# Patient Record
Sex: Female | Born: 1935
Health system: Southern US, Community
[De-identification: ages and names within clinical notes are randomized; demographics above are authoritative.]

## PROBLEM LIST (undated history)

## (undated) DIAGNOSIS — E785 Hyperlipidemia, unspecified: Secondary | ICD-10-CM

## (undated) DIAGNOSIS — I10 Essential (primary) hypertension: Secondary | ICD-10-CM

---

## 2004-08-04 ENCOUNTER — Ambulatory Visit (HOSPITAL_COMMUNITY): Admission: RE | Admit: 2004-08-04 | Discharge: 2004-08-04 | Payer: Self-pay | Admitting: Family Medicine

## 2005-08-07 ENCOUNTER — Ambulatory Visit (HOSPITAL_COMMUNITY): Admission: RE | Admit: 2005-08-07 | Discharge: 2005-08-07 | Payer: Self-pay | Admitting: Family Medicine

## 2006-08-21 ENCOUNTER — Ambulatory Visit (HOSPITAL_COMMUNITY): Admission: RE | Admit: 2006-08-21 | Discharge: 2006-08-21 | Payer: Self-pay | Admitting: Family Medicine

## 2007-08-22 ENCOUNTER — Ambulatory Visit (HOSPITAL_COMMUNITY): Admission: RE | Admit: 2007-08-22 | Discharge: 2007-08-22 | Payer: Self-pay | Admitting: Family Medicine

## 2008-09-02 ENCOUNTER — Ambulatory Visit (HOSPITAL_COMMUNITY): Admission: RE | Admit: 2008-09-02 | Discharge: 2008-09-02 | Payer: Self-pay | Admitting: Family Medicine

## 2009-10-12 ENCOUNTER — Ambulatory Visit (HOSPITAL_COMMUNITY): Admission: RE | Admit: 2009-10-12 | Discharge: 2009-10-12 | Payer: Self-pay | Admitting: Family Medicine

## 2010-04-10 ENCOUNTER — Encounter: Payer: Self-pay | Admitting: Family Medicine

## 2010-04-11 ENCOUNTER — Encounter: Payer: Self-pay | Admitting: Pulmonary Disease

## 2010-11-15 ENCOUNTER — Other Ambulatory Visit (HOSPITAL_COMMUNITY): Payer: Self-pay | Admitting: Family Medicine

## 2010-11-15 DIAGNOSIS — Z139 Encounter for screening, unspecified: Secondary | ICD-10-CM

## 2010-11-23 ENCOUNTER — Ambulatory Visit (HOSPITAL_COMMUNITY)
Admission: RE | Admit: 2010-11-23 | Discharge: 2010-11-23 | Disposition: A | Discharge status: Home / Self Care | Payer: Medicare Other | Source: Ambulatory Visit | Attending: Family Medicine | Admitting: Family Medicine

## 2010-11-23 DIAGNOSIS — Z139 Encounter for screening, unspecified: Secondary | ICD-10-CM

## 2010-11-23 DIAGNOSIS — Z1231 Encounter for screening mammogram for malignant neoplasm of breast: Secondary | ICD-10-CM | POA: Insufficient documentation

## 2010-11-24 ENCOUNTER — Ambulatory Visit (HOSPITAL_COMMUNITY): Payer: Medicare Other

## 2010-12-29 ENCOUNTER — Other Ambulatory Visit (HOSPITAL_COMMUNITY): Payer: Self-pay | Admitting: Family Medicine

## 2010-12-29 DIAGNOSIS — Z139 Encounter for screening, unspecified: Secondary | ICD-10-CM

## 2011-01-04 ENCOUNTER — Ambulatory Visit (HOSPITAL_COMMUNITY)
Admission: RE | Admit: 2011-01-04 | Discharge: 2011-01-04 | Disposition: A | Discharge status: Home / Self Care | Payer: Medicare Other | Source: Ambulatory Visit | Attending: Family Medicine | Admitting: Family Medicine

## 2011-01-04 DIAGNOSIS — Z13820 Encounter for screening for osteoporosis: Secondary | ICD-10-CM | POA: Insufficient documentation

## 2011-01-04 DIAGNOSIS — Z139 Encounter for screening, unspecified: Secondary | ICD-10-CM

## 2011-01-04 DIAGNOSIS — Z780 Asymptomatic menopausal state: Secondary | ICD-10-CM | POA: Insufficient documentation

## 2011-04-17 ENCOUNTER — Telehealth: Payer: Self-pay

## 2011-04-17 NOTE — Telephone Encounter (Signed)
Gastroenterology Pre-Procedure Form   Pt said she will call back in 2 weeks to schedule appt.    Request Date: 04/17/2011       Requesting Physician: Dr.Fusco     PATIENT INFORMATION:  Miranda Rose is a 76 y.o., female (DOB=Oct 03, 1935).  PROCEDURE: Procedure(s) requested: colonoscopy Procedure Reason: screening for colon cancer  PATIENT REVIEW QUESTIONS: The patient reports the following:   1. Diabetes Melitis: no 2. Joint replacements in the past 12 months: no 3. Major health problems in the past 3 months: no 4. Has an artificial valve or MVP:no 5. Has been advised in past to take antibiotics in advance of a procedure like teeth cleaning: no}    MEDICATIONS & ALLERGIES:    Patient reports the following regarding taking any blood thinners:   Plavix? no Aspirin?no Coumadin?  no  Patient confirms/reports the following medications:  Current Outpatient Prescriptions  Medication Sig Dispense Refill  . enalapril-hydrochlorothiazide (VASERETIC) 10-25 MG per tablet Take 1 tablet by mouth daily.      . pravastatin (PRAVACHOL) 40 MG tablet Take 40 mg by mouth daily.        Patient confirms/reports the following allergies:  No Known Allergies  Patient is appropriate to schedule for requested procedure(s): yes  AUTHORIZATION INFORMATION Primary Insurance:   ID #:   Group #:  Pre-Cert / Auth required:  Pre-Cert / Auth #:   Secondary Insurance:   ID #:   Group #:  Pre-Cert / Auth required Pre-Cert / Auth #:   No orders of the defined types were placed in this encounter.    SCHEDULE INFORMATION: Procedure has been scheduled as follows:  Date:                    Time:   Location:   This Gastroenterology Pre-Precedure Form is being routed to the following provider(s) for review: R. Roetta Sessions, MD    Called pt, she is not home.

## 2011-04-17 NOTE — Telephone Encounter (Signed)
OK as is.

## 2011-04-24 NOTE — Telephone Encounter (Signed)
Called pt. She will review schedule and call me back. Said she would like to have it week after next.

## 2011-05-11 NOTE — Telephone Encounter (Signed)
Letter mailed to pt to call to get scheduled.

## 2011-08-07 ENCOUNTER — Telehealth: Payer: Self-pay

## 2011-08-07 ENCOUNTER — Other Ambulatory Visit: Payer: Self-pay

## 2011-08-07 DIAGNOSIS — Z139 Encounter for screening, unspecified: Secondary | ICD-10-CM

## 2011-08-07 MED ORDER — PEG 3350-KCL-NA BICARB-NACL 420 G PO SOLR: ORAL | Status: AC

## 2011-08-07 NOTE — Telephone Encounter (Signed)
Rx and instructions mailed to pt.  

## 2011-08-07 NOTE — Progress Notes (Signed)
Rx and instructions mailed to pt.  

## 2011-08-07 NOTE — Telephone Encounter (Signed)
Gastroenterology Pre-Procedure Form  Pt had been previoulsy triaged and never scheduled. She reports an allergy to Glucophage this time, but is not currently on any meds for diabetes. She said she was recently seen at the PCP and they will call her if she needs to be on a medication.   Request Date: 08/07/2011       Requesting Physician: Dr. Sherwood Gambler     PATIENT INFORMATION:  Miranda Rose is a 76 y.o., female (DOB=12-Dec-1935).  PROCEDURE: Procedure(s) requested: colonoscopy Procedure Reason: screening for colon cancer  PATIENT REVIEW QUESTIONS: The patient reports the following:   1. Diabetes Melitis: no 2. Joint replacements in the past 12 months: no 3. Major health problems in the past 3 months: no 4. Has an artificial valve or MVP:no 5. Has been advised in past to take antibiotics in advance of a procedure like teeth cleaning: no}    MEDICATIONS & ALLERGIES:    Patient reports the following regarding taking any blood thinners:   Plavix? no Aspirin?no Coumadin?  no  Patient confirms/reports the following medications:  Current Outpatient Prescriptions  Medication Sig Dispense Refill  . aspirin 81 MG tablet Take 81 mg by mouth daily.      . enalapril-hydrochlorothiazide (VASERETIC) 10-25 MG per tablet Take 1 tablet by mouth daily.      . pravastatin (PRAVACHOL) 40 MG tablet Take 40 mg by mouth daily.        Patient confirms/reports the following allergies:  Allergies  Allergen Reactions  . Glucophage (Metformin Hydrochloride) Diarrhea    Patient is appropriate to schedule for requested procedure(s): yes  AUTHORIZATION INFORMATION Primary Insurance:   ID #:   Group #:  Pre-Cert / Auth required: Pre-Cert / Auth #:  Secondary Insurance:  ID #:   Group #:  Pre-Cert / Auth required:  Pre-Cert / Auth #:  No orders of the defined types were placed in this encounter.    SCHEDULE INFORMATION: Procedure has been scheduled as follows:  Date: 09/15/2011      Time: 8:15 AM    Location: [facility] Short Stay  This Gastroenterology Pre-Precedure Form is being routed to the following provider(s) for review: R. Roetta Sessions, MD

## 2011-08-07 NOTE — Telephone Encounter (Signed)
OK to proceed with colonoscopy.

## 2011-08-29 ENCOUNTER — Telehealth: Payer: Self-pay

## 2011-08-29 NOTE — Telephone Encounter (Signed)
LMOM to call to update triage.  

## 2011-09-07 ENCOUNTER — Encounter (HOSPITAL_COMMUNITY): Payer: Self-pay | Admitting: Pharmacy Technician

## 2011-09-07 NOTE — Telephone Encounter (Signed)
Called pt to update triage. She said that she has not had any new problems and no change in medications.  She has been scheduled for 09/15/2011 with RMR for colonoscopy.

## 2011-09-08 NOTE — Telephone Encounter (Signed)
OK for colonoscopy.  

## 2011-09-11 NOTE — Progress Notes (Unsigned)
Pt called and needed to change her appt on 09/15/2011 for her colonocopy. She needed a later time in the day. I got her switched and both pts are happy with the times. Selena Batten is aware. Pt is aware that she is

## 2011-09-12 ENCOUNTER — Telehealth: Payer: Self-pay | Admitting: Internal Medicine

## 2011-09-12 NOTE — Telephone Encounter (Signed)
Pt has her Rx. She was just not aware it was with her papers/instructions.

## 2011-09-12 NOTE — Telephone Encounter (Signed)
Pt needs prep rx faxed to [facility] in [place]

## 2011-09-15 ENCOUNTER — Encounter (HOSPITAL_COMMUNITY)
Admission: RE | Disposition: A | Discharge status: Home Health | Payer: Self-pay | Source: Ambulatory Visit | Attending: Internal Medicine

## 2011-09-15 ENCOUNTER — Ambulatory Visit (HOSPITAL_COMMUNITY)
Admission: RE | Admit: 2011-09-15 | Discharge: 2011-09-15 | Disposition: A | Discharge status: Home Health | Payer: Medicare Other | Source: Ambulatory Visit | Attending: Internal Medicine | Admitting: Internal Medicine

## 2011-09-15 ENCOUNTER — Encounter (HOSPITAL_COMMUNITY): Payer: Self-pay

## 2011-09-15 DIAGNOSIS — Z1211 Encounter for screening for malignant neoplasm of colon: Secondary | ICD-10-CM | POA: Insufficient documentation

## 2011-09-15 DIAGNOSIS — Z79899 Other long term (current) drug therapy: Secondary | ICD-10-CM | POA: Insufficient documentation

## 2011-09-15 DIAGNOSIS — K5730 Diverticulosis of large intestine without perforation or abscess without bleeding: Secondary | ICD-10-CM

## 2011-09-15 DIAGNOSIS — E785 Hyperlipidemia, unspecified: Secondary | ICD-10-CM | POA: Insufficient documentation

## 2011-09-15 DIAGNOSIS — Z139 Encounter for screening, unspecified: Secondary | ICD-10-CM

## 2011-09-15 DIAGNOSIS — K648 Other hemorrhoids: Secondary | ICD-10-CM

## 2011-09-15 DIAGNOSIS — Z7982 Long term (current) use of aspirin: Secondary | ICD-10-CM | POA: Insufficient documentation

## 2011-09-15 DIAGNOSIS — I10 Essential (primary) hypertension: Secondary | ICD-10-CM | POA: Insufficient documentation

## 2011-09-15 HISTORY — DX: Essential (primary) hypertension: I10

## 2011-09-15 HISTORY — PX: COLONOSCOPY: SHX5424

## 2011-09-15 HISTORY — DX: Hyperlipidemia, unspecified: E78.5

## 2011-09-15 SURGERY — COLONOSCOPY
Anesthesia: Moderate Sedation

## 2011-09-15 MED ORDER — STERILE WATER FOR IRRIGATION IR SOLN
Status: DC | PRN
  Administered 2011-09-15: 11:00:00

## 2011-09-15 MED ORDER — MEPERIDINE HCL 100 MG/ML IJ SOLN
INTRAMUSCULAR | Status: AC
  Filled 2011-09-15: qty 2

## 2011-09-15 MED ORDER — SODIUM CHLORIDE 0.45 % IV SOLN: Freq: Once | INTRAVENOUS | Status: DC

## 2011-09-15 MED ORDER — MIDAZOLAM HCL 5 MG/5ML IJ SOLN
INTRAMUSCULAR | Status: AC
  Filled 2011-09-15: qty 10

## 2011-09-15 MED ORDER — MIDAZOLAM HCL 5 MG/5ML IJ SOLN
INTRAMUSCULAR | Status: DC | PRN
  Administered 2011-09-15: 1 mg via INTRAVENOUS
  Administered 2011-09-15: 2 mg via INTRAVENOUS
  Administered 2011-09-15: 1 mg via INTRAVENOUS

## 2011-09-15 MED ORDER — MEPERIDINE HCL 100 MG/ML IJ SOLN
INTRAMUSCULAR | Status: DC | PRN
  Administered 2011-09-15: 50 mg via INTRAVENOUS
  Administered 2011-09-15: 25 mg via INTRAVENOUS

## 2011-09-15 NOTE — Op Note (Signed)
[facility] [address] [place], [place]  [address]  COLONOSCOPY PROCEDURE REPORT  PATIENT:  Miranda Rose, Miranda Rose  MR#:  [id number] BIRTHDATE:  1935/09/08, 75 yrs. old  GENDER:  female ENDOSCOPIST:  R. Roetta Sessions, MD FACP [facility] REF. BY:  Artis Delay, M.D. PROCEDURE DATE:  09/15/2011 PROCEDURE:  Screening colonoscopy  INDICATIONS:  First ever average risk screening examination  INFORMED CONSENT:  The risks, benefits, alternatives and imponderables including but not limited to bleeding, perforation as well as the possibility of a missed lesion have been reviewed. The potential for biopsy, lesion removal, etc. have also been discussed.  Questions have been answered.  All parties agreeable. Please see the history and physical in the medical record for more information.  MEDICATIONS:  Versed 4 mg IV and Demerol 75 mg IV in divided doses.  DESCRIPTION OF PROCEDURE:  After a digital rectal exam was performed, the EC-[id number] ([id number]) colonoscope was advanced from the anus through the rectum and colon to the area of the cecum, ileocecal valve and appendiceal orifice.  The cecum was deeply intubated.  These structures were well-seen and photographed for the record.  From the level of the cecum and ileocecal valve, the scope was slowly and cautiously withdrawn.  The mucosal surfaces were carefully surveyed utilizing scope tip deflection to facilitate fold flattening as needed.  The scope was pulled down into the rectum where a thorough examination including retroflexion was performed. <<PROCEDUREIMAGES>>  FINDINGS: Minimal internal hemorrhoids; otherwise normal rectum. Scattered left-sided diverticula; otherwise, the remainder of the colonic mucosa appeared normal.  THERAPEUTIC / DIAGNOSTIC MANEUVERS PERFORMED:  None  COMPLICATIONS:  None  CECAL WITHDRAWAL TIME:  8 minutes  IMPRESSION:   Internal hemorrhoids. Colonic diverticulosis.  RECOMMENDATIONS:  Consider one more  screening colonoscopy in 10 years only if overall health permits.  ______________________________ R. Roetta Sessions, MD Caleen Essex  CC:  Artis Delay, M.D.  n. eSIGNED:   R. Roetta Sessions at 09/15/2011 11:19 AM  Janeann Merl, [phone]

## 2011-09-15 NOTE — Discharge Instructions (Addendum)
Colonoscopy Discharge Instructions  Read the instructions outlined below and refer to this sheet in the next few weeks. These discharge instructions provide you with general information on caring for yourself after you leave the hospital. Your doctor may also give you specific instructions. While your treatment has been planned according to the most current medical practices available, unavoidable complications occasionally occur. If you have any problems or questions after discharge, call Dr. Jena Gauss at [phone]. ACTIVITY  You may resume your regular activity, but move at a slower pace for the next 24 hours.   Take frequent rest periods for the next 24 hours.   Walking will help get rid of the air and reduce the bloated feeling in your belly (abdomen).   No driving for 24 hours (because of the medicine (anesthesia) used during the test).    Do not sign any important legal documents or operate any machinery for 24 hours (because of the anesthesia used during the test).  NUTRITION  Drink plenty of fluids.   You may resume your normal diet as instructed by your doctor.   Begin with a light meal and progress to your normal diet. Heavy or fried foods are harder to digest and may make you feel sick to your stomach (nauseated).   Avoid alcoholic beverages for 24 hours or as instructed.  MEDICATIONS  You may resume your normal medications unless your doctor tells you otherwise.  WHAT YOU CAN EXPECT TODAY  Some feelings of bloating in the abdomen.   Passage of more gas than usual.   Spotting of blood in your stool or on the toilet paper.  IF YOU HAD POLYPS REMOVED DURING THE COLONOSCOPY:  No aspirin products for 7 days or as instructed.   No alcohol for 7 days or as instructed.   Eat a soft diet for the next 24 hours.  FINDING OUT THE RESULTS OF YOUR TEST Not all test results are available during your visit. If your test results are not back during the visit, make an appointment  with your caregiver to find out the results. Do not assume everything is normal if you have not heard from your caregiver or the medical facility. It is important for you to follow up on all of your test results.  SEEK IMMEDIATE MEDICAL ATTENTION IF:  You have more than a spotting of blood in your stool.   Your belly is swollen (abdominal distention).   You are nauseated or vomiting.   You have a temperature over 101.   You have abdominal pain or discomfort that is severe or gets worse throughout the day.   Diverticulosis information provided.  Consider one more screening colonoscopy in 10 years if overall health permits.  Diverticulosis Diverticulosis is a common condition that develops when small pouches (diverticula) form in the wall of the colon. The risk of diverticulosis increases with age. It happens more often in people who eat a low-fiber diet. Most individuals with diverticulosis have no symptoms. Those individuals with symptoms usually experience abdominal pain, constipation, or loose stools (diarrhea). HOME CARE INSTRUCTIONS   Increase the amount of fiber in your diet as directed by your caregiver or dietician. This may reduce symptoms of diverticulosis.   Your caregiver may recommend taking a dietary fiber supplement.   Drink at least 6 to 8 glasses of water each day to prevent constipation.   Try not to strain when you have a bowel movement.   Your caregiver may recommend avoiding nuts and seeds to prevent  complications, although this is still an uncertain benefit.   Only take over-the-counter or prescription medicines for pain, discomfort, or fever as directed by your caregiver.  FOODS WITH HIGH FIBER CONTENT INCLUDE:  Fruits. Apple, peach, pear, tangerine, raisins, prunes.   Vegetables. Brussels sprouts, asparagus, broccoli, cabbage, carrot, cauliflower, romaine lettuce, spinach, summer squash, tomato, winter squash, zucchini.   Starchy Vegetables. Baked beans,  kidney beans, lima beans, split peas, lentils, potatoes (with skin).   Grains. Whole wheat bread, brown rice, bran flake cereal, plain oatmeal, white rice, shredded wheat, bran muffins.  SEEK IMMEDIATE MEDICAL CARE IF:   You develop increasing pain or severe bloating.   You have an oral temperature above 102 F (38.9 C), not controlled by medicine.   You develop vomiting or bowel movements that are bloody or black.  Document Released: 12/02/2003 Document Revised: 02/23/2011 Document Reviewed: 08/04/2009 [facility] Patient Information 2012 [place], [place].

## 2011-09-15 NOTE — Interval H&P Note (Signed)
History and Physical Interval Note:  09/15/2011 11:24 AM  Miranda Rose  has presented today for surgery, with the diagnosis of Screening  The various methods of treatment have been discussed with the patient and family. After consideration of risks, benefits and other options for treatment, the patient has consented to  Procedure(s) (LRB): COLONOSCOPY (N/A) as a surgical intervention .  The patient's history has been reviewed, patient examined, no change in status, stable for surgery.  I have reviewed the patients' chart and labs.  Questions were answered to the patient's satisfaction.     [name]

## 2011-09-15 NOTE — H&P (Signed)
  Primary Care Physician:  Colette Ribas, MD Primary Gastroenterologist:  Dr. Jena Gauss  Pre-Procedure History & Physical: HPI:  Miranda Rose is a 76 y.o. female is here for a screening colonoscopy.  First-ever examination. No bowel symptoms. No family history of colon cancer or polyps.  Past Medical History  Diagnosis Date  . Hypertension   . Hyperlipemia     No past surgical history on file.  Prior to Admission medications   Medication Sig Start Date End Date Taking? Authorizing Provider  aspirin EC 81 MG tablet Take 81 mg by mouth daily.   Yes Historical Provider, MD  enalapril-hydrochlorothiazide (VASERETIC) 10-25 MG per tablet Take 1 tablet by mouth daily.   Yes Historical Provider, MD  pravastatin (PRAVACHOL) 40 MG tablet Take 40 mg by mouth daily.   Yes Historical Provider, MD    Allergies as of 08/07/2011 - Review Complete 08/07/2011  Allergen Reaction Noted  . Glucophage (metformin hydrochloride) Diarrhea 04/17/2011    No family history on file.  History   Social History  . Marital Status: Divorced    Spouse Name: N/A    Number of Children: N/A  . Years of Education: N/A   Occupational History  . Not on file.   Social History Main Topics  . Smoking status: Not on file  . Smokeless tobacco: Not on file  . Alcohol Use:   . Drug Use:   . Sexually Active:    Other Topics Concern  . Not on file   Social History Narrative  . No narrative on file    Review of Systems: See HPI, otherwise negative ROS  Physical Exam: BP 135/86  Pulse 92  Temp 98.1 F (36.7 C) (Oral)  Resp 16  Ht 5' 2.25" (1.581 m)  Wt 140 lb (63.504 kg)  BMI 25.40 kg/m2  SpO2 97% General:   Alert,  Well-developed, well-nourished, pleasant and cooperative in NAD Head:  Normocephalic and atraumatic. Eyes:  Sclera clear, no icterus.   Conjunctiva pink. Ears:  Normal auditory acuity. Nose:  No deformity, discharge,  or lesions. Mouth:  No deformity or lesions, dentition  normal. Neck:  Supple; no masses or thyromegaly. Lungs:  Clear throughout to auscultation.   No wheezes, crackles, or rhonchi. No acute distress. Heart:  Regular rate and rhythm; no murmurs, clicks, rubs,  or gallops. Abdomen:  Soft, nontender and nondistended. No masses, hepatosplenomegaly or hernias noted. Normal bowel sounds, without guarding, and without rebound.   Msk:  Symmetrical without gross deformities. Normal posture. Pulses:  Normal pulses noted. Extremities:  Without clubbing or edema. Neurologic:  Alert and  oriented x4;  grossly normal neurologically. Skin:  Intact without significant lesions or rashes. Cervical Nodes:  No significant cervical adenopathy. Psych:  Alert and cooperative. Normal mood and affect.  Impression/Plan: Miranda Rose is now here to undergo a screening colonoscopy. First-ever average risk screening examination.  Risks, benefits, limitations, imponderables and alternatives regarding colonoscopy have been reviewed with the patient. Questions have been answered. All parties agreeable.

## 2011-09-19 ENCOUNTER — Encounter (HOSPITAL_COMMUNITY): Payer: Self-pay | Admitting: Internal Medicine

## 2012-08-07 ENCOUNTER — Other Ambulatory Visit (HOSPITAL_COMMUNITY): Payer: Self-pay | Admitting: Physician Assistant

## 2012-08-07 DIAGNOSIS — Z139 Encounter for screening, unspecified: Secondary | ICD-10-CM

## 2012-08-13 ENCOUNTER — Ambulatory Visit (HOSPITAL_COMMUNITY)
Admission: RE | Admit: 2012-08-13 | Discharge: 2012-08-13 | Disposition: A | Discharge status: Home / Self Care | Payer: Medicare Other | Source: Ambulatory Visit | Attending: Physician Assistant | Admitting: Physician Assistant

## 2012-08-13 DIAGNOSIS — Z139 Encounter for screening, unspecified: Secondary | ICD-10-CM

## 2012-08-13 DIAGNOSIS — Z1231 Encounter for screening mammogram for malignant neoplasm of breast: Secondary | ICD-10-CM | POA: Insufficient documentation

## 2013-09-11 ENCOUNTER — Other Ambulatory Visit (HOSPITAL_COMMUNITY): Payer: Self-pay | Admitting: Family Medicine

## 2013-09-11 DIAGNOSIS — Z139 Encounter for screening, unspecified: Secondary | ICD-10-CM

## 2013-09-24 ENCOUNTER — Ambulatory Visit (HOSPITAL_COMMUNITY)
Admission: RE | Admit: 2013-09-24 | Discharge: 2013-09-24 | Disposition: A | Discharge status: Home / Self Care | Payer: Medicare Other | Source: Ambulatory Visit | Attending: Family Medicine | Admitting: Family Medicine

## 2013-09-24 DIAGNOSIS — Z1231 Encounter for screening mammogram for malignant neoplasm of breast: Secondary | ICD-10-CM | POA: Insufficient documentation

## 2013-09-24 DIAGNOSIS — Z139 Encounter for screening, unspecified: Secondary | ICD-10-CM

## 2013-09-29 ENCOUNTER — Other Ambulatory Visit: Payer: Self-pay | Admitting: Family Medicine

## 2013-09-29 DIAGNOSIS — R928 Other abnormal and inconclusive findings on diagnostic imaging of breast: Secondary | ICD-10-CM

## 2013-10-21 ENCOUNTER — Ambulatory Visit (HOSPITAL_COMMUNITY)
Admission: RE | Admit: 2013-10-21 | Discharge: 2013-10-21 | Disposition: A | Discharge status: Home / Self Care | Payer: Medicare Other | Source: Ambulatory Visit | Attending: Family Medicine | Admitting: Family Medicine

## 2013-10-21 ENCOUNTER — Encounter (HOSPITAL_COMMUNITY): Payer: Medicare Other

## 2013-10-21 DIAGNOSIS — N63 Unspecified lump in unspecified breast: Secondary | ICD-10-CM | POA: Insufficient documentation

## 2013-10-21 DIAGNOSIS — R928 Other abnormal and inconclusive findings on diagnostic imaging of breast: Secondary | ICD-10-CM

## 2014-01-12 ENCOUNTER — Other Ambulatory Visit (HOSPITAL_COMMUNITY): Payer: Self-pay | Admitting: Family Medicine

## 2014-01-12 DIAGNOSIS — M810 Age-related osteoporosis without current pathological fracture: Secondary | ICD-10-CM

## 2014-01-15 ENCOUNTER — Ambulatory Visit (HOSPITAL_COMMUNITY)
Admission: RE | Admit: 2014-01-15 | Discharge: 2014-01-15 | Disposition: A | Discharge status: Home / Self Care | Payer: Medicare Other | Source: Ambulatory Visit | Attending: Family Medicine | Admitting: Family Medicine

## 2014-01-15 DIAGNOSIS — M810 Age-related osteoporosis without current pathological fracture: Secondary | ICD-10-CM | POA: Diagnosis not present

## 2014-07-15 DIAGNOSIS — J029 Acute pharyngitis, unspecified: Secondary | ICD-10-CM | POA: Diagnosis not present

## 2014-07-15 DIAGNOSIS — E663 Overweight: Secondary | ICD-10-CM | POA: Diagnosis not present

## 2014-07-15 DIAGNOSIS — J302 Other seasonal allergic rhinitis: Secondary | ICD-10-CM | POA: Diagnosis not present

## 2014-07-15 DIAGNOSIS — Z6826 Body mass index (BMI) 26.0-26.9, adult: Secondary | ICD-10-CM | POA: Diagnosis not present

## 2014-08-13 DIAGNOSIS — E1129 Type 2 diabetes mellitus with other diabetic kidney complication: Secondary | ICD-10-CM | POA: Diagnosis not present

## 2014-08-13 DIAGNOSIS — Z0001 Encounter for general adult medical examination with abnormal findings: Secondary | ICD-10-CM | POA: Diagnosis not present

## 2014-08-13 DIAGNOSIS — Z1389 Encounter for screening for other disorder: Secondary | ICD-10-CM | POA: Diagnosis not present

## 2014-08-13 DIAGNOSIS — I10 Essential (primary) hypertension: Secondary | ICD-10-CM | POA: Diagnosis not present

## 2014-08-13 DIAGNOSIS — Z6826 Body mass index (BMI) 26.0-26.9, adult: Secondary | ICD-10-CM | POA: Diagnosis not present

## 2014-08-13 DIAGNOSIS — E782 Mixed hyperlipidemia: Secondary | ICD-10-CM | POA: Diagnosis not present

## 2014-10-12 DIAGNOSIS — Z0001 Encounter for general adult medical examination with abnormal findings: Secondary | ICD-10-CM | POA: Diagnosis not present

## 2014-10-12 DIAGNOSIS — E119 Type 2 diabetes mellitus without complications: Secondary | ICD-10-CM | POA: Diagnosis not present

## 2014-10-12 DIAGNOSIS — Z6826 Body mass index (BMI) 26.0-26.9, adult: Secondary | ICD-10-CM | POA: Diagnosis not present

## 2014-10-12 DIAGNOSIS — Z1389 Encounter for screening for other disorder: Secondary | ICD-10-CM | POA: Diagnosis not present

## 2014-10-12 DIAGNOSIS — E782 Mixed hyperlipidemia: Secondary | ICD-10-CM | POA: Diagnosis not present

## 2014-12-24 DIAGNOSIS — N182 Chronic kidney disease, stage 2 (mild): Secondary | ICD-10-CM | POA: Diagnosis not present

## 2014-12-24 DIAGNOSIS — E663 Overweight: Secondary | ICD-10-CM | POA: Diagnosis not present

## 2014-12-24 DIAGNOSIS — Z1389 Encounter for screening for other disorder: Secondary | ICD-10-CM | POA: Diagnosis not present

## 2014-12-24 DIAGNOSIS — Z6827 Body mass index (BMI) 27.0-27.9, adult: Secondary | ICD-10-CM | POA: Diagnosis not present

## 2014-12-24 DIAGNOSIS — I10 Essential (primary) hypertension: Secondary | ICD-10-CM | POA: Diagnosis not present

## 2015-01-27 DIAGNOSIS — I10 Essential (primary) hypertension: Secondary | ICD-10-CM | POA: Diagnosis not present

## 2015-01-27 DIAGNOSIS — E782 Mixed hyperlipidemia: Secondary | ICD-10-CM | POA: Diagnosis not present

## 2015-01-27 DIAGNOSIS — E119 Type 2 diabetes mellitus without complications: Secondary | ICD-10-CM | POA: Diagnosis not present

## 2015-01-27 DIAGNOSIS — Z23 Encounter for immunization: Secondary | ICD-10-CM | POA: Diagnosis not present

## 2015-01-27 DIAGNOSIS — Z1389 Encounter for screening for other disorder: Secondary | ICD-10-CM | POA: Diagnosis not present

## 2015-01-27 DIAGNOSIS — E1129 Type 2 diabetes mellitus with other diabetic kidney complication: Secondary | ICD-10-CM | POA: Diagnosis not present

## 2015-01-27 DIAGNOSIS — Z6826 Body mass index (BMI) 26.0-26.9, adult: Secondary | ICD-10-CM | POA: Diagnosis not present

## 2015-02-02 DIAGNOSIS — E1129 Type 2 diabetes mellitus with other diabetic kidney complication: Secondary | ICD-10-CM | POA: Diagnosis not present

## 2015-03-23 DIAGNOSIS — Z1389 Encounter for screening for other disorder: Secondary | ICD-10-CM | POA: Diagnosis not present

## 2015-03-23 DIAGNOSIS — J019 Acute sinusitis, unspecified: Secondary | ICD-10-CM | POA: Diagnosis not present

## 2015-03-23 DIAGNOSIS — J301 Allergic rhinitis due to pollen: Secondary | ICD-10-CM | POA: Diagnosis not present

## 2015-09-16 DIAGNOSIS — E119 Type 2 diabetes mellitus without complications: Secondary | ICD-10-CM | POA: Diagnosis not present

## 2015-09-16 DIAGNOSIS — Z1389 Encounter for screening for other disorder: Secondary | ICD-10-CM | POA: Diagnosis not present

## 2015-09-16 DIAGNOSIS — I10 Essential (primary) hypertension: Secondary | ICD-10-CM | POA: Diagnosis not present

## 2015-09-16 DIAGNOSIS — E782 Mixed hyperlipidemia: Secondary | ICD-10-CM | POA: Diagnosis not present

## 2016-01-03 DIAGNOSIS — J0100 Acute maxillary sinusitis, unspecified: Secondary | ICD-10-CM | POA: Diagnosis not present

## 2016-01-03 DIAGNOSIS — Z0000 Encounter for general adult medical examination without abnormal findings: Secondary | ICD-10-CM | POA: Diagnosis not present

## 2016-01-03 DIAGNOSIS — J069 Acute upper respiratory infection, unspecified: Secondary | ICD-10-CM | POA: Diagnosis not present

## 2016-01-03 DIAGNOSIS — E119 Type 2 diabetes mellitus without complications: Secondary | ICD-10-CM | POA: Diagnosis not present

## 2016-01-03 DIAGNOSIS — N182 Chronic kidney disease, stage 2 (mild): Secondary | ICD-10-CM | POA: Diagnosis not present

## 2016-02-17 DIAGNOSIS — Z23 Encounter for immunization: Secondary | ICD-10-CM | POA: Diagnosis not present

## 2016-06-15 DIAGNOSIS — E1129 Type 2 diabetes mellitus with other diabetic kidney complication: Secondary | ICD-10-CM | POA: Diagnosis not present

## 2016-06-15 DIAGNOSIS — E782 Mixed hyperlipidemia: Secondary | ICD-10-CM | POA: Diagnosis not present

## 2016-06-15 DIAGNOSIS — I10 Essential (primary) hypertension: Secondary | ICD-10-CM | POA: Diagnosis not present

## 2016-06-15 DIAGNOSIS — E119 Type 2 diabetes mellitus without complications: Secondary | ICD-10-CM | POA: Diagnosis not present

## 2016-07-21 ENCOUNTER — Encounter (HOSPITAL_COMMUNITY): Payer: Self-pay | Admitting: *Deleted

## 2016-07-21 ENCOUNTER — Emergency Department (HOSPITAL_COMMUNITY)
Admission: EM | Admit: 2016-07-21 | Discharge: 2016-07-21 | Disposition: A | Discharge status: Home / Self Care | Payer: Medicare Other | Attending: Emergency Medicine | Admitting: Emergency Medicine

## 2016-07-21 DIAGNOSIS — Y999 Unspecified external cause status: Secondary | ICD-10-CM | POA: Insufficient documentation

## 2016-07-21 DIAGNOSIS — Z7982 Long term (current) use of aspirin: Secondary | ICD-10-CM | POA: Insufficient documentation

## 2016-07-21 DIAGNOSIS — S39012A Strain of muscle, fascia and tendon of lower back, initial encounter: Secondary | ICD-10-CM | POA: Insufficient documentation

## 2016-07-21 DIAGNOSIS — T148XXA Other injury of unspecified body region, initial encounter: Secondary | ICD-10-CM

## 2016-07-21 DIAGNOSIS — S3992XA Unspecified injury of lower back, initial encounter: Secondary | ICD-10-CM | POA: Diagnosis present

## 2016-07-21 DIAGNOSIS — Y9389 Activity, other specified: Secondary | ICD-10-CM | POA: Insufficient documentation

## 2016-07-21 DIAGNOSIS — Z79899 Other long term (current) drug therapy: Secondary | ICD-10-CM | POA: Insufficient documentation

## 2016-07-21 DIAGNOSIS — Y92410 Unspecified street and highway as the place of occurrence of the external cause: Secondary | ICD-10-CM | POA: Diagnosis not present

## 2016-07-21 DIAGNOSIS — I10 Essential (primary) hypertension: Secondary | ICD-10-CM | POA: Diagnosis not present

## 2016-07-21 NOTE — ED Notes (Signed)
States she needs documentation for insurance
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
## 2016-08-29 DIAGNOSIS — H2513 Age-related nuclear cataract, bilateral: Secondary | ICD-10-CM | POA: Diagnosis not present

## 2016-12-22 DIAGNOSIS — E1129 Type 2 diabetes mellitus with other diabetic kidney complication: Secondary | ICD-10-CM | POA: Diagnosis not present

## 2016-12-22 DIAGNOSIS — I10 Essential (primary) hypertension: Secondary | ICD-10-CM | POA: Diagnosis not present

## 2016-12-22 DIAGNOSIS — E119 Type 2 diabetes mellitus without complications: Secondary | ICD-10-CM | POA: Diagnosis not present

## 2016-12-22 DIAGNOSIS — Z23 Encounter for immunization: Secondary | ICD-10-CM | POA: Diagnosis not present

## 2016-12-22 DIAGNOSIS — Z1389 Encounter for screening for other disorder: Secondary | ICD-10-CM | POA: Diagnosis not present

## 2016-12-22 DIAGNOSIS — N182 Chronic kidney disease, stage 2 (mild): Secondary | ICD-10-CM | POA: Diagnosis not present

## 2017-02-15 ENCOUNTER — Other Ambulatory Visit: Payer: Self-pay

## 2017-02-15 NOTE — Patient Outreach (Signed)
[facility] Care Management  02/15/2017  Miranda HoughBarbara J Rose [birth year] [id number]   Medication Adherence call to Mrs. Janeann MerlBarbara Zumstein patient is showing past due under [facility]on simvastatin 40 mg spoke with patient she ask if we can call [facility] and order her medication for her [facility] will have medication ready for patient to pick up.   Lillia AbedAna Ollison-Moran CPhT Pharmacy Technician [facility] Care Management Direct Dial [phone]  Fax [phone] [email or web address]

## 2017-05-08 ENCOUNTER — Other Ambulatory Visit (HOSPITAL_COMMUNITY): Payer: Self-pay | Admitting: Family Medicine

## 2017-05-08 ENCOUNTER — Ambulatory Visit (HOSPITAL_COMMUNITY)
Admission: RE | Admit: 2017-05-08 | Discharge: 2017-05-08 | Disposition: A | Discharge status: Home / Self Care | Payer: Medicare Other | Source: Ambulatory Visit | Attending: Family Medicine | Admitting: Family Medicine

## 2017-05-08 DIAGNOSIS — M25511 Pain in right shoulder: Secondary | ICD-10-CM

## 2017-05-08 DIAGNOSIS — E663 Overweight: Secondary | ICD-10-CM | POA: Diagnosis not present

## 2017-05-08 DIAGNOSIS — Z6827 Body mass index (BMI) 27.0-27.9, adult: Secondary | ICD-10-CM | POA: Diagnosis not present

## 2017-05-08 DIAGNOSIS — E1129 Type 2 diabetes mellitus with other diabetic kidney complication: Secondary | ICD-10-CM | POA: Diagnosis not present

## 2017-05-08 DIAGNOSIS — M1991 Primary osteoarthritis, unspecified site: Secondary | ICD-10-CM | POA: Diagnosis not present

## 2017-05-18 DIAGNOSIS — M65811 Other synovitis and tenosynovitis, right shoulder: Secondary | ICD-10-CM | POA: Diagnosis not present

## 2017-05-18 DIAGNOSIS — E1129 Type 2 diabetes mellitus with other diabetic kidney complication: Secondary | ICD-10-CM | POA: Diagnosis not present

## 2017-05-19 ENCOUNTER — Emergency Department (HOSPITAL_COMMUNITY): Payer: Medicare Other

## 2017-05-19 ENCOUNTER — Emergency Department (HOSPITAL_COMMUNITY)
Admission: EM | Admit: 2017-05-19 | Discharge: 2017-05-19 | Disposition: A | Discharge status: Home / Self Care | Payer: Medicare Other | Attending: Emergency Medicine | Admitting: Emergency Medicine

## 2017-05-19 ENCOUNTER — Other Ambulatory Visit: Payer: Self-pay

## 2017-05-19 ENCOUNTER — Encounter (HOSPITAL_COMMUNITY): Payer: Self-pay | Admitting: Emergency Medicine

## 2017-05-19 DIAGNOSIS — M4722 Other spondylosis with radiculopathy, cervical region: Secondary | ICD-10-CM

## 2017-05-19 DIAGNOSIS — I10 Essential (primary) hypertension: Secondary | ICD-10-CM | POA: Insufficient documentation

## 2017-05-19 DIAGNOSIS — Z7982 Long term (current) use of aspirin: Secondary | ICD-10-CM | POA: Insufficient documentation

## 2017-05-19 DIAGNOSIS — M25511 Pain in right shoulder: Secondary | ICD-10-CM | POA: Diagnosis not present

## 2017-05-19 DIAGNOSIS — M542 Cervicalgia: Secondary | ICD-10-CM

## 2017-05-19 DIAGNOSIS — Z79899 Other long term (current) drug therapy: Secondary | ICD-10-CM | POA: Insufficient documentation

## 2017-05-19 MED ORDER — DIAZEPAM 2 MG PO TABS: 2.0000 mg | ORAL_TABLET | Freq: Three times a day (TID) | ORAL | 0 refills | Status: AC | PRN

## 2017-05-19 MED ORDER — PREDNISONE 20 MG PO TABS: 20.0000 mg | ORAL_TABLET | Freq: Two times a day (BID) | ORAL | 0 refills | Status: DC

## 2017-05-19 NOTE — Discharge Instructions (Signed)
Continue using heat on the sore area 3 or 4 times a day for 30 minutes.  Start the prescription for prednisone, today or tomorrow.  Be careful when taking the muscle relaxer, diazepam, because it can make you sleepy or dizzy.  Return here, if needed, for problems.
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
## 2017-05-21 ENCOUNTER — Other Ambulatory Visit: Payer: Self-pay

## 2017-05-21 NOTE — Patient Outreach (Signed)
Attempted to contact patient per recent ED visit. Unsuccessful letter, know before you go and magnet mailed.

## 2017-05-30 ENCOUNTER — Encounter (INDEPENDENT_AMBULATORY_CARE_PROVIDER_SITE_OTHER): Payer: Self-pay | Admitting: Orthopaedic Surgery

## 2017-05-30 ENCOUNTER — Ambulatory Visit (INDEPENDENT_AMBULATORY_CARE_PROVIDER_SITE_OTHER): Payer: Medicare Other | Admitting: Orthopaedic Surgery

## 2017-05-30 DIAGNOSIS — M47812 Spondylosis without myelopathy or radiculopathy, cervical region: Secondary | ICD-10-CM

## 2017-05-30 DIAGNOSIS — M7541 Impingement syndrome of right shoulder: Secondary | ICD-10-CM | POA: Diagnosis not present
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
## 2017-06-04 ENCOUNTER — Other Ambulatory Visit: Payer: Self-pay

## 2017-06-04 ENCOUNTER — Encounter (HOSPITAL_COMMUNITY): Payer: Self-pay

## 2017-06-04 ENCOUNTER — Ambulatory Visit (HOSPITAL_COMMUNITY): Payer: Medicare Other | Attending: Orthopaedic Surgery

## 2017-06-04 DIAGNOSIS — M79601 Pain in right arm: Secondary | ICD-10-CM

## 2017-06-04 DIAGNOSIS — M542 Cervicalgia: Secondary | ICD-10-CM | POA: Diagnosis not present

## 2017-06-04 DIAGNOSIS — M545 Low back pain, unspecified: Secondary | ICD-10-CM

## 2017-06-04 DIAGNOSIS — M5412 Radiculopathy, cervical region: Secondary | ICD-10-CM | POA: Diagnosis not present

## 2017-06-04 NOTE — Patient Instructions (Signed)
Ear / Shoulder Stretch    Exhaling, move left ear toward left shoulder. Hold position for _10__ breaths. Inhaling, bring head back to center. Repeat to other side. Repeat sequence 2_ times. Do _2__ times per day.  Copyright  VHI. All rights reserved.

## 2017-06-04 NOTE — Therapy (Signed)
[facility] [address] [place], [place], [address] Phone: [phone]   Fax:  [phone]  Physical Therapy Evaluation  Patient Details  Name: Miranda Rose MRN: [id number] Date of Birth: 09/11/1935 Referring Provider: Norlene Campbell   Encounter Date: 06/04/2017  PT End of Session - 06/04/17 1003    Visit Number  1    Number of Visits  12    Date for PT Re-Evaluation  07/16/17    Authorization Type  co-pay $40.00 [employer]; no deduct 1/1-12/31/19    Authorization Time Period  mini re-eval 06/25/2017    PT Start Time  0900    PT Stop Time  0946    PT Time Calculation (min)  46 min    Activity Tolerance  Patient tolerated treatment well    Behavior During Therapy  [facility] for tasks assessed/performed       Past Medical History:  Diagnosis Date  . Hyperlipemia   . Hypertension     Past Surgical History:  Procedure Laterality Date  . COLONOSCOPY  09/15/2011   Procedure: COLONOSCOPY;  Surgeon: Corbin Ade, MD;  [facility];  Service: Endoscopy;  Laterality: N/A;  8:15 AM    There were no vitals filed for this visit.   Subjective Assessment - 06/04/17 0912    Subjective  About 3-4 weeks ago started with neck pain down to the right arm. Went to ER showing arthritis in neck, rx codeine but does not take; takes Tylenol every 6 hrs or as needed. Sometimes has LBP but inconsistent.    Patient is accompained by:  Family member    Pertinent History  HTN    Limitations  Other (comment) Sleeping    How long can you sit comfortably?  not limited    How long can you stand comfortably?  not limited    How long can you walk comfortably?  not limited    Patient Stated Goals  see if we can get rid of pain and see where it is coming from    Currently in Pain?  Yes    Pain Score  8     Pain Location  Neck    Pain Orientation  Right;Lateral    Pain Descriptors / Indicators  Aching;Throbbing    Pain Type  Acute pain    Pain Radiating  Towards  radiating to R upper trap, bicep and sometimes forearm extensors    Pain Onset  1 to 4 weeks ago    Pain Frequency  Constant    Aggravating Factors   lying down, especially on left side    Pain Relieving Factors  movement, turning head to right side    Effect of Pain on Daily Activities  still does everything but with pain         [facility] PT Assessment - 06/04/17 0001      Assessment   Medical Diagnosis  DDD cervical/lumbar spine    Referring Provider  Norlene Campbell    Onset Date/Surgical Date  05/04/17    Hand Dominance  Left    Next MD Visit  None yet    Prior Therapy  No      Precautions   Precautions  None      Restrictions   Weight Bearing Restrictions  No      Balance Screen   Has the patient fallen in the past 6 months  No    Has the patient had a decrease in activity  level because of a fear of falling?   No    Is the patient reluctant to leave their home because of a fear of falling?   No      Home Public house managernvironment   Living Environment  Private residence      Prior Function   Level of Independence  Independent    Vocation  Retired    Leisure  cleaning the house; casinos and small trips/outings      Cognition   Overall Cognitive Status  Within Functional Limits for tasks assessed      Observation/Other Assessments   Observations  forward head/rounded shoulders, next hyperextension, headl tilted to rt    Focus on Therapeutic Outcomes (FOTO)   29% limited      Sensation   Light Touch  Appears Intact      Posture/Postural Control   Posture/Postural Control  Postural limitations    Postural Limitations  Rounded Shoulders;Forward head;Increased thoracic kyphosis      AROM   Right Shoulder Flexion  -- above shoulder level painful    Right Shoulder ABduction  -- above shoulder level painful    Cervical Flexion  full    Cervical Extension  full significant cervical hyperext makes AROM appear limited    Cervical - Right Side Bend  50% limited eases pain     Cervical - Left Side Bend  75% limited aggs pain    Cervical - Right Rotation  25% limited eases pain    Cervical - Left Rotation  50% limited aggs pain    Lumbar Flexion  25% limited    Lumbar Extension  25% limited midline pain/stiff    Lumbar - Right Side Bend  25% limited midline pain/ache    Lumbar - Left Side Bend  25% limited midline pain/ache    Lumbar - Right Rotation  50% limited stiff    Lumbar - Left Rotation  50% limited stiff      Flexibility   Soft Tissue Assessment /Muscle Length  yes      Palpation   Palpation comment  Rt scalene tender, suboccipital tightness      Special Tests    Special Tests  Cervical      Spurling's   Findings  Negative    Side  Right      Distraction Test   Findngs  Positive    side  Right    Comment  decreased Rt arm pain             Objective measurements completed on examination: See above findings.              PT Education - 06/04/17 1003    Education provided  Yes    Starwood HotelsPerson(s) Educated  Patient    Methods  Explanation;Demonstration;Handout    Comprehension  Verbal cues required;Tactile cues required;Need further instruction       PT Short Term Goals - 06/04/17 1014      PT SHORT TERM GOAL #1   Title  Patient will perform HEP independently.    Time  3    Period  Weeks    Status  New    Target Date  06/25/17      PT SHORT TERM GOAL #2   Title  Patient will be able to sleep only waking 1 time to take pain medication.    Time  3    Period  Weeks    Status  New      PT SHORT  TERM GOAL #3   Title  Patient will be able to perform normal cleaning activities with no greater than 5/10 pain.    Time  3    Period  Weeks    Status  New        PT Long Term Goals - 06/04/17 1018      PT LONG TERM GOAL #1   Title  Patient will be independent in advanced HEP to manage her neck, right arm and LBP pain.    Time  6    Period  Weeks    Status  New    Target Date  07/16/17      PT LONG TERM GOAL #2    Title  Patient will be able to sleep through the night with pain medications.    Time  6    Period  Weeks    Status  New      PT LONG TERM GOAL #3   Title  Patient will be able to drive 2 hours to take a day trip without c/o neck/right arm pain.     Time  6    Period  Weeks    Status  New      PT LONG TERM GOAL #4   Title  Patient will report no > 2/10 pain in the last week.    Time  6    Period  Weeks    Status  New             Plan - 06/04/17 1006    Clinical Impression Statement  Patient is a 82 year old female presenting with cervical and Rt upper extremity pain > LBP. Patient cervical and Rt arm symptoms were elevated with distraction, and PROM and aggravated with AROM of C-spine, especially to Lt. Patient reports sleeping to be the most problematic and movement helps. Patient presents with decreased range of motion, increased pain, postural abnormalities, and needs for body mechanics instruction. Patient would benefit from skilled PT to address these deficits.     History and Personal Factors relevant to plan of care:  HTN; patient active and motivated to move; wants to learn    Clinical Presentation  Stable    Clinical Presentation due to:  decrease spine AROM, increased pain, postural abnormalities, muscle strain, radicular syptoms.    Clinical Decision Making  Moderate    Rehab Potential  Good    PT Frequency  2x / week    PT Duration  6 weeks    PT Treatment/Interventions  ADLs/Self Care Home Management;Electrical Stimulation;Cryotherapy;Moist Heat;Traction;Ultrasound;Therapeutic activities;Therapeutic exercise;Neuromuscular re-education;Patient/family education;Manual techniques;Manual lymph drainage;Passive range of motion;Dry needling;Energy conservation    PT Next Visit Plan  decompression exercises 1-5, log rolling, chin tuck, body mechanics, postural awareness    PT Home Exercise Plan  initial - seated scalene stretch    Consulted and Agree with Plan of Care   Patient       Patient will benefit from skilled therapeutic intervention in order to improve the following deficits and impairments:  Increased fascial restricitons, Improper body mechanics, Pain, Decreased mobility, Postural dysfunction, Decreased range of motion, Hypomobility, Impaired flexibility  Visit Diagnosis: Cervicalgia  Pain in right arm  Radiculopathy, cervical region  Acute midline low back pain without sciatica     Problem List There are no active problems to display for this patient.   Katina Dung. Hartnett-Rands, MS, PT Per [id number] 06/04/2017, 10:30 AM  [facility] [address] [place], [place], [address] Phone: [phone]   Fax:  [phone]  Name: Miranda Rose MRN: [id number] Date of Birth: 1935/09/17

## 2017-06-04 NOTE — Addendum Note (Signed)
Addended by: Epifanio LeschesHARTNETT-RANDS, Arlyce on: 06/04/2017 10:33 AM   Modules accepted: Orders

## 2017-06-06 ENCOUNTER — Ambulatory Visit (HOSPITAL_COMMUNITY): Payer: Medicare Other | Admitting: Physical Therapy

## 2017-06-06 ENCOUNTER — Telehealth (HOSPITAL_COMMUNITY): Payer: Self-pay | Admitting: Family Medicine

## 2017-06-06 DIAGNOSIS — M79601 Pain in right arm: Secondary | ICD-10-CM | POA: Diagnosis not present

## 2017-06-06 DIAGNOSIS — M545 Low back pain, unspecified: Secondary | ICD-10-CM

## 2017-06-06 DIAGNOSIS — M542 Cervicalgia: Secondary | ICD-10-CM | POA: Diagnosis not present

## 2017-06-06 DIAGNOSIS — M5412 Radiculopathy, cervical region: Secondary | ICD-10-CM

## 2017-06-06 NOTE — Therapy (Signed)
[facility] [address] [place], [place], [address] Phone: [phone]   Fax:  [phone]  Physical Therapy Treatment  Patient Details  Name: Miranda Rose MRN: [id number] Date of Birth: [birth year] Referring Provider: Norlene Campbell   Encounter Date: 06/06/2017  PT End of Session - 06/06/17 1023    Visit Number  2    Number of Visits  12    Date for PT Re-Evaluation  07/16/17    Authorization Type  co-pay $40.00 [employer]; no deduct 1/1-12/31/19    Authorization Time Period  mini re-eval 06/25/2017    PT Start Time  0952    PT Stop Time  1038    PT Time Calculation (min)  46 min    Activity Tolerance  Patient tolerated treatment well    Behavior During Therapy  [facility] for tasks assessed/performed       Past Medical History:  Diagnosis Date  . Hyperlipemia   . Hypertension     Past Surgical History:  Procedure Laterality Date  . COLONOSCOPY  09/15/2011   Procedure: COLONOSCOPY;  Surgeon: Corbin Ade, MD;  [facility];  Service: Endoscopy;  Laterality: N/A;  8:15 AM    There were no vitals filed for this visit.  Subjective Assessment - 06/06/17 1110    Subjective  Pt returns today with 6/10 pain in her Rt cervical/shoulder region.  Reports complaince with stretch and also completing cervical mobiltiy is now helping, especially rotation.      Currently in Pain?  Yes    Pain Score  6     Pain Location  Neck    Pain Orientation  Right    Pain Descriptors / Indicators  Aching;Shooting    Pain Radiating Towards  radiating to Rt UE                      [facility] Adult PT Treatment/Exercise - 06/06/17 0001      Neck Exercises: Seated   Neck Retraction  Limitations    Neck Retraction Limitations  attempted in sitting but unable to complete in correct form    W Back  10 reps;Limitations    W Back Limitations  3" holds    Shoulder Shrugs  10 reps    Postural Training  discussed and obtained proper postural  alignment    Other Seated Exercise  cervical excursions 5 reps each      Neck Exercises: Supine   Neck Retraction  5 reps    Neck Retraction Limitations  max verbal and tactile cues    Other Supine Exercise  logroll education    Other Supine Exercise  decompression 1-5      Manual Therapy   Manual Therapy  Soft tissue mobilization    Manual therapy comments  completed seperately from all other skilled interventions    Soft tissue mobilization  Rt cervical/UT region to decrease pain and spasm in sitting             PT Education - 06/06/17 1001    Education provided  Yes    Education Details  reveiwed goals and HEP per initial evaluation, educated on posture and logroll. discussed possible causes of symptoms down Rt LE.  Initiated new therex    Person(s) Educated  Patient    Methods  Explanation;Demonstration;Tactile cues;Verbal cues;Handout    Comprehension  Returned demonstration;Verbalized understanding;Verbal cues required;Tactile cues required;Need further instruction       PT Short Term  Goals - 06/04/17 1014      PT SHORT TERM GOAL #1   Title  Patient will perform HEP independently.    Time  3    Period  Weeks    Status  New    Target Date  06/25/17      PT SHORT TERM GOAL #2   Title  Patient will be able to sleep only waking 1 time to take pain medication.    Time  3    Period  Weeks    Status  New      PT SHORT TERM GOAL #3   Title  Patient will be able to perform normal cleaning activities with no greater than 5/10 pain.    Time  3    Period  Weeks    Status  New        PT Long Term Goals - 06/04/17 1018      PT LONG TERM GOAL #1   Title  Patient will be independent in advanced HEP to manage her neck, right arm and LBP pain.    Time  6    Period  Weeks    Status  New    Target Date  07/16/17      PT LONG TERM GOAL #2   Title  Patient will be able to sleep through the night with pain medications.    Time  6    Period  Weeks    Status  New       PT LONG TERM GOAL #3   Title  Patient will be able to drive 2 hours to take a day trip without c/o neck/right arm pain.     Time  6    Period  Weeks    Status  New      PT LONG TERM GOAL #4   Title  Patient will report no > 2/10 pain in the last week.    Time  6    Period  Weeks    Status  New            Plan - 06/06/17 1102    Clinical Impression Statement  Reviewed HEP and goals per initial evalution.  Pt without lumbar complaints, only cervical with radiating pain in to her Rt UE.  Instructed with new therex per PT POC with patient needing maximal cues, both manual and verbal, to complete exercises in correct form.  Pt with difficulty coordinating movements correctly and requires supervision for hold times and count.  Educated on posture and how this impacts her pain and dysfunction.  Instructed with logroll and had pateint complete /practice.  Manual at end with focus on decreasing spasm and pain.  Several large knots in upper trap and scapular region and general tightness throughout on Rt side.  Able to reduce all these areas and pateint left treatment painfree with much improved cervical mobilty.      Rehab Potential  Good    PT Frequency  2x / week    PT Duration  6 weeks    PT Treatment/Interventions  ADLs/Self Care Home Management;Electrical Stimulation;Cryotherapy;Moist Heat;Traction;Ultrasound;Therapeutic activities;Therapeutic exercise;Neuromuscular re-education;Patient/family education;Manual techniques;Manual lymph drainage;Passive range of motion;Dry needling;Energy conservation    PT Next Visit Plan  Continue with therex and education on postural awareness.  May try traction if symtoms persist into Rt UE.  May warm up on UBE and add postural theraband strengthening.     PT Home Exercise Plan  initial - seated scalene stretch  3/20:  decompression 1-5    Consulted and Agree with Plan of Care  Patient       Patient will benefit from skilled therapeutic intervention in  order to improve the following deficits and impairments:  Increased fascial restricitons, Improper body mechanics, Pain, Decreased mobility, Postural dysfunction, Decreased range of motion, Hypomobility, Impaired flexibility  Visit Diagnosis: Cervicalgia  Pain in right arm  Radiculopathy, cervical region  Acute midline low back pain without sciatica     Problem List There are no active problems to display for this patient.  Lurena Nida, PTA/CLT [phone]  Lurena Nida 06/06/2017, 11:12 AM  [facility] [address] [place], [place], [address] Phone: [phone]   Fax:  [phone]  Name: Miranda Rose MRN: [id number] Date of Birth: June 04, 1935

## 2017-06-06 NOTE — Telephone Encounter (Signed)
06/06/17  she said that Miranda Rose was coming and didn't know the time of day they would be there and so rescheduled appt

## 2017-06-12 ENCOUNTER — Encounter (HOSPITAL_COMMUNITY): Payer: Medicare Other

## 2017-06-13 ENCOUNTER — Ambulatory Visit (HOSPITAL_COMMUNITY): Payer: Medicare Other | Admitting: Physical Therapy

## 2017-06-13 DIAGNOSIS — M79601 Pain in right arm: Secondary | ICD-10-CM | POA: Diagnosis not present

## 2017-06-13 DIAGNOSIS — M545 Low back pain, unspecified: Secondary | ICD-10-CM

## 2017-06-13 DIAGNOSIS — M542 Cervicalgia: Secondary | ICD-10-CM

## 2017-06-13 DIAGNOSIS — M5412 Radiculopathy, cervical region: Secondary | ICD-10-CM

## 2017-06-13 NOTE — Patient Instructions (Signed)
  Doorway Pectoral Stretch: low  Stand in a doorway with both hands low on the wall as shown. Shoulders should be relaxed.  Step forward through the doorway approx. 8-12" with one foot. Do not arch your low back.  A stretch should be felt in front of the shoulder and chest.   Hold 30 seconds, complete 3 reps   facing wall slide abduction with lift off  standing facing the wall, eyes forward, arms slightly out and at shoulder level, slide forearms out and up at 45 degrees slowly, and without arching your lower back.  When arms are fully extended, lift hands off the wall by tilting shoulder blades back and down.  Return hands to wall and slowly slide arms back to start position.  Repeat 10 times   Shoulder Flexion  Standing against wall and palms facing hips, arms straight, reach overhead keeping straight arms throughout the motion, finishing with hands overhead and palms forward(see picture).  Lift 10 times 2X day.

## 2017-06-13 NOTE — Therapy (Signed)
[facility] [address] [place], [place], [address] Phone: [phone]   Fax:  [phone]  Physical Therapy Treatment  Patient Details  Name: TERSA Rose MRN: [id number] Date of Birth: 03/09/1936 Referring Provider: Norlene Campbell   Encounter Date: 06/13/2017  PT End of Session - 06/13/17 0951    Visit Number  3    Number of Visits  12    Date for PT Re-Evaluation  07/16/17    Authorization Type  co-pay $40.00 [employer]; no deduct 1/1-12/31/19    Authorization Time Period  mini re-eval 06/25/2017    PT Start Time  0905    PT Stop Time  0947    PT Time Calculation (min)  42 min    Activity Tolerance  Patient tolerated treatment well    Behavior During Therapy  [facility] for tasks assessed/performed       Past Medical History:  Diagnosis Date  . Hyperlipemia   . Hypertension     Past Surgical History:  Procedure Laterality Date  . COLONOSCOPY  09/15/2011   Procedure: COLONOSCOPY;  Surgeon: Corbin Ade, MD;  [facility];  Service: Endoscopy;  Laterality: N/A;  8:15 AM    There were no vitals filed for this visit.  Subjective Assessment - 06/13/17 0909    Subjective  Pt states it is feeling much better overall and therapy is helping.  States she didn't have any pain for a couple days after therapy but then returned but not as bad..   currently 4/10 Rt cervical and into her upper Rt UE. states she hasn't taken anything for pain until yesterday morning (advil).      Currently in Pain?  Yes    Pain Score  4     Pain Location  Neck    Pain Orientation  Right                No data recorded       [facility] Adult PT Treatment/Exercise - 06/13/17 0001      Neck Exercises: Machines for Strengthening   UBE (Upper Arm Bike)  3 minutes level 1 backward      Neck Exercises: Theraband   Scapula Retraction  10 reps;Red    Shoulder Extension  10 reps;Red    Rows  10 reps;Red      Neck Exercises: Standing   Other  Standing Exercises  wall arch 10 reps     Other Standing Exercises  UE flexion against wall 10 reps      Neck Exercises: Seated   W Back  10 reps;Limitations    W Back Limitations  3" holds    Other Seated Exercise  cervical excursions 5 reps each      Manual Therapy   Manual Therapy  Soft tissue mobilization    Manual therapy comments  completed seperately from all other skilled interventions    Soft tissue mobilization  Rt cervical/UT region to decrease pain and spasm in sitting      Neck Exercises: Stretches   Corner Stretch  2 reps;30 seconds             PT Education - 06/13/17 1004    Education provided  Yes    Education Details  updated HEP to include UE wall arch, wall flexion and corner/doorway stretch    Person(s) Educated  Patient    Methods  Explanation;Demonstration;Tactile cues;Verbal cues;Handout    Comprehension  Verbalized understanding;Returned demonstration;Verbal cues required;Tactile cues required  PT Short Term Goals - 06/04/17 1014      PT SHORT TERM GOAL #1   Title  Patient will perform HEP independently.    Time  3    Period  Weeks    Status  New    Target Date  06/25/17      PT SHORT TERM GOAL #2   Title  Patient will be able to sleep only waking 1 time to take pain medication.    Time  3    Period  Weeks    Status  New      PT SHORT TERM GOAL #3   Title  Patient will be able to perform normal cleaning activities with no greater than 5/10 pain.    Time  3    Period  Weeks    Status  New        PT Long Term Goals - 06/04/17 1018      PT LONG TERM GOAL #1   Title  Patient will be independent in advanced HEP to manage her neck, right arm and LBP pain.    Time  6    Period  Weeks    Status  New    Target Date  07/16/17      PT LONG TERM GOAL #2   Title  Patient will be able to sleep through the night with pain medications.    Time  6    Period  Weeks    Status  New      PT LONG TERM GOAL #3   Title  Patient will be  able to drive 2 hours to take a day trip without c/o neck/right arm pain.     Time  6    Period  Weeks    Status  New      PT LONG TERM GOAL #4   Title  Patient will report no > 2/10 pain in the last week.    Time  6    Period  Weeks    Status  New            Plan - 06/13/17 [id number]    Clinical Impression Statement  Continued focus on postural strengthening and pain reduction.  Began with UBE to warm up postural mm and added postural theraband strengthening, UE flexion and extension against wall and corner stretch.  Updated HEP to include last 3 exercises.  Pt required manual and verbal cues to decrease shoulder elevation with postural theraband strengthening.  IMProved form with cervical excursion with less substitution.  Continued with manual with one large spasm along Rt inferior scapular border . Able to completely resolve with manual and patient report being painfree at EOS.    Rehab Potential  Good    PT Frequency  2x / week    PT Duration  6 weeks    PT Treatment/Interventions  ADLs/Self Care Home Management;Electrical Stimulation;Cryotherapy;Moist Heat;Traction;Ultrasound;Therapeutic activities;Therapeutic exercise;Neuromuscular re-education;Patient/family education;Manual techniques;Manual lymph drainage;Passive range of motion;Dry needling;Energy conservation    PT Next Visit Plan  Continue with therex and education on postural awareness.  May try traction if symtoms persist into Rt UE.  Resume decompression exeriises next session to ensure independence.  Continue to progress postural strengthening.      PT Home Exercise Plan  initial - seated scalene stretch  3/20:  decompression 1-5    Consulted and Agree with Plan of Care  Patient       Patient will benefit from skilled therapeutic intervention in order  to improve the following deficits and impairments:  Increased fascial restricitons, Improper body mechanics, Pain, Decreased mobility, Postural dysfunction, Decreased range of  motion, Hypomobility, Impaired flexibility  Visit Diagnosis: Cervicalgia  Radiculopathy, cervical region  Acute midline low back pain without sciatica  Pain in right arm     Problem List There are no active problems to display for this patient.  Lurena Nida, PTA/CLT [phone]  Lurena Nida 06/13/2017, 10:05 AM  [place] [facility] [address] [place], [place], [address] Phone: [phone]   Fax:  [phone]  Name: Miranda Rose MRN: [id number] Date of Birth: [birth year]

## 2017-06-15 ENCOUNTER — Encounter (HOSPITAL_COMMUNITY): Payer: Self-pay | Admitting: Physical Therapy

## 2017-06-15 ENCOUNTER — Ambulatory Visit (HOSPITAL_COMMUNITY): Payer: Medicare Other | Admitting: Physical Therapy

## 2017-06-15 DIAGNOSIS — M79601 Pain in right arm: Secondary | ICD-10-CM

## 2017-06-15 DIAGNOSIS — M545 Low back pain, unspecified: Secondary | ICD-10-CM

## 2017-06-15 DIAGNOSIS — M542 Cervicalgia: Secondary | ICD-10-CM

## 2017-06-15 DIAGNOSIS — M5412 Radiculopathy, cervical region: Secondary | ICD-10-CM | POA: Diagnosis not present

## 2017-06-15 NOTE — Patient Instructions (Addendum)
Flexibility: Neck Retraction    Pull head straight back, keeping eyes and jaw level. Repeat _10___ times per set. Do _1___ sets per session. Do 2___ sessions per day.  http://orth.exer.us/344   Copyright  VHI. All rights reserved.  Scapular Retraction (Standing)    With arms at sides, pinch shoulder blades together. Repeat _10___ times per set. Do _1___ sets per session. Do _2___ sessions per day.  http://orth.exer.us/944   Copyright  VHI. All rights reserved.   

## 2017-06-15 NOTE — Therapy (Signed)
[facility] [address] [place], [place], [address] Phone: [phone]   Fax:  [phone]  Physical Therapy Treatment  Patient Details  Name: Miranda Rose MRN: [id number] Date of Birth: 04-16-1935 Referring Provider: Norlene CampbellPeter Whitfield   Encounter Date: 06/15/2017  PT End of Session - 06/15/17 1112    Visit Number  4    Number of Visits  12    Date for PT Re-Evaluation  07/16/17    Authorization Type  co-pay $40.00 [employer]; no deduct 1/1-12/31/19    Authorization Time Period  mini re-eval 06/25/2017    PT Start Time  1035    PT Stop Time  1113    PT Time Calculation (min)  38 min    Activity Tolerance  Patient tolerated treatment well    Behavior During Therapy  [facility]       Past Medical History:  Diagnosis Date  . Hyperlipemia   . Hypertension     Past Surgical History:  Procedure Laterality Date  . COLONOSCOPY  09/15/2011   Procedure: COLONOSCOPY;  Surgeon: Corbin Adeobert M Rourk, MD;  [facility];  Service: Endoscopy;  Laterality: N/A;  8:15 AM    There were no vitals filed for this visit.  Subjective Assessment - 06/15/17 1035    Subjective  PT states that she is having an aching in her right shoulder     Currently in Pain?  Yes    Pain Score  3     Pain Location  Shoulder    Pain Orientation  Right    Pain Descriptors / Indicators  Aching;Throbbing    Pain Type  Acute pain    Pain Onset  In the past 7 days    Pain Frequency  Intermittent    Aggravating Factors   activity     Pain Relieving Factors  not sure                No data recorded       [facility] Adult PT Treatment/Exercise - 06/15/17 0001      Neck Exercises: Theraband   Scapula Retraction  10 reps;Green    Shoulder Extension  10 reps;Green    Rows  10 reps;Green      Neck Exercises: Standing   Other Standing Exercises  wall arch 10 reps     Other Standing Exercises  UE flexion against wall 10 reps      Neck Exercises: Seated   W Back  10 reps;Limitations      Neck Exercises: Supine   Other Supine Exercise  decompression 1-5      Manual Therapy   Manual Therapy  Joint mobilization;Soft tissue mobilization;Manual Traction    Manual therapy comments  completed seperately from all other skilled interventions    Joint Mobilization  grade II    Manual Traction  hold x 15" x 6               PT Short Term Goals - 06/15/17 1118      PT SHORT TERM GOAL #1   Title  Patient will perform HEP independently.    Time  3    Period  Weeks    Status  On-going      PT SHORT TERM GOAL #2   Title  Patient will be able to sleep only waking 1 time to take pain medication.    Time  3    Period  Weeks  Status  On-going      PT SHORT TERM GOAL #3   Title  Patient will be able to perform normal cleaning activities with no greater than 5/10 pain.    Time  3    Period  Weeks    Status  On-going        PT Long Term Goals - 06/15/17 1119      PT LONG TERM GOAL #1   Title  Patient will be independent in advanced HEP to manage her neck, right arm and LBP pain.    Time  6    Period  Weeks    Status  On-going      PT LONG TERM GOAL #2   Title  Patient will be able to sleep through the night with pain medications.    Time  6    Period  Weeks    Status  On-going      PT LONG TERM GOAL #3   Title  Patient will be able to drive 2 hours to take a day trip without c/o neck/right arm pain.     Time  6    Period  Weeks    Status  On-going      PT LONG TERM GOAL #4   Title  Patient will report no > 2/10 pain in the last week.    Time  6    Period  Weeks    Status  On-going            Plan - 06/15/17 1113    Clinical Impression Statement  Gave pt cervical and scapular retraction for a HEP.  Began manual traction with good results.  Increased intensity of postural t-band exercses by going to green level.      Rehab Potential  Good    PT Frequency  2x / week    PT Duration  6  weeks    PT Treatment/Interventions  ADLs/Self Care Home Management;Electrical Stimulation;Cryotherapy;Moist Heat;Traction;Ultrasound;Therapeutic activities;Therapeutic exercise;Neuromuscular re-education;Patient/family education;Manual techniques;Manual lymph drainage;Passive range of motion;Dry needling;Energy conservation    PT Next Visit Plan  Continue with therex and education on postural awareness.  May try traction if symtoms persist into Rt UE.  Begin decompression t-band exercises.     PT Home Exercise Plan  initial - seated scalene stretch  3/20:  decompression 1-5    Consulted and Agree with Plan of Care  Patient       Patient will benefit from skilled therapeutic intervention in order to improve the following deficits and impairments:  Increased fascial restricitons, Improper body mechanics, Pain, Decreased mobility, Postural dysfunction, Decreased range of motion, Hypomobility, Impaired flexibility  Visit Diagnosis: Cervicalgia  Radiculopathy, cervical region  Acute midline low back pain without sciatica  Pain in right arm     Problem List There are no active problems to display for this patient.   Virgina Organ, PT CLT [phone] 06/15/2017, 11:19 AM  [facility] [address] [place], [place], [address] Phone: [phone]   Fax:  [phone]  Name: Miranda Rose MRN: [id number] Date of Birth: 15-Aug-1935

## 2017-06-18 ENCOUNTER — Ambulatory Visit (HOSPITAL_COMMUNITY): Payer: Medicare Other | Attending: Orthopaedic Surgery | Admitting: Physical Therapy

## 2017-06-18 DIAGNOSIS — M79601 Pain in right arm: Secondary | ICD-10-CM | POA: Diagnosis not present

## 2017-06-18 DIAGNOSIS — M542 Cervicalgia: Secondary | ICD-10-CM | POA: Insufficient documentation

## 2017-06-18 DIAGNOSIS — M5412 Radiculopathy, cervical region: Secondary | ICD-10-CM | POA: Insufficient documentation

## 2017-06-18 DIAGNOSIS — M545 Low back pain, unspecified: Secondary | ICD-10-CM

## 2017-06-18 NOTE — Therapy (Signed)
[facility] [address] [place], [place], [address] Phone: [phone]   Fax:  [phone]  Physical Therapy Treatment  Patient Details  Name: Miranda Rose MRN: [id number] Date of Birth: 11-28-1935 Referring Provider: Norlene CampbellPeter Whitfield   Encounter Date: 06/18/2017  PT End of Session - 06/18/17 1102    Visit Number  5    Number of Visits  12    Date for PT Re-Evaluation  07/16/17    Authorization Type  co-pay $40.00 U[employer]; no deduct 1/1-12/31/19    Authorization Time Period  mini re-eval 06/25/2017    PT Start Time  1035    PT Stop Time  1115    PT Time Calculation (min)  40 min    Activity Tolerance  Patient tolerated treatment well    Behavior During Therapy  [facility] for tasks assessed/performed       Past Medical History:  Diagnosis Date  . Hyperlipemia   . Hypertension     Past Surgical History:  Procedure Laterality Date  . COLONOSCOPY  09/15/2011   Procedure: COLONOSCOPY;  Surgeon: Corbin Adeobert M Rourk, MD;  [facility];  Service: Endoscopy;  Laterality: N/A;  8:15 AM    There were no vitals filed for this visit.  Subjective Assessment - 06/18/17 1100    Subjective  PT states her back rarely has back pain and currently she is just aching in her Rt shoulder "from the cold" but no real pain.   States she is sleeping much better and has no radicupathy.    Currently in Pain?  No/denies                       [facility] - 06/18/17 0001      Neck Exercises: Machines for Strengthening   UBE (Upper Arm Bike)  3 minutes level 1 backward      Neck Exercises: Theraband   Scapula Retraction  10 reps;Green    Shoulder Extension  10 reps;Green    Rows  10 reps;Green      Neck Exercises: Standing   Other Standing Exercises  wall arch 10 reps     Other Standing Exercises  UE flexion against wall 10 reps      Neck Exercises: Seated   W Back  10 reps;Limitations    W Back Limitations  3"  holds      Manual Therapy   Manual Therapy  Soft tissue mobilization    Manual therapy comments  completed seperately from all other skilled interventions    Soft tissue mobilization  Rt cervical/UT region to decrease pain and spasm in sitting               PT Short Term Goals - 06/15/17 1118      PT SHORT TERM GOAL #1   Title  Patient will perform HEP independently.    Time  3    Period  Weeks    Status  On-going      PT SHORT TERM GOAL #2   Title  Patient will be able to sleep only waking 1 time to take pain medication.    Time  3    Period  Weeks    Status  On-going      PT SHORT TERM GOAL #3   Title  Patient will be able to perform normal cleaning activities with no greater than 5/10 pain.    Time  3  Period  Weeks    Status  On-going        PT Long Term Goals - 06/15/17 1119      PT LONG TERM GOAL #1   Title  Patient will be independent in advanced HEP to manage her neck, right arm and LBP pain.    Time  6    Period  Weeks    Status  On-going      PT LONG TERM GOAL #2   Title  Patient will be able to sleep through the night with pain medications.    Time  6    Period  Weeks    Status  On-going      PT LONG TERM GOAL #3   Title  Patient will be able to drive 2 hours to take a day trip without c/o neck/right arm pain.     Time  6    Period  Weeks    Status  On-going      PT LONG TERM GOAL #4   Title  Patient will report no > 2/10 pain in the last week.    Time  6    Period  Weeks    Status  On-going            Plan - 06/18/17 1103    Clinical Impression Statement  continued with focus on postural strengthening and education.  Continued with established therex and requires manual and verbal cues to decrease shoulder elevation with therex.  Reports little pain lately, more aching in her shoulder from arthritis.  No radiculopathy lately.  pt with general tightness in Rt shoulder but no spasms as before.  Pt progressing well.      Rehab  Potential  Good    PT Frequency  2x / week    PT Duration  6 weeks    PT Treatment/Interventions  ADLs/Self Care Home Management;Electrical Stimulation;Cryotherapy;Moist Heat;Traction;Ultrasound;Therapeutic activities;Therapeutic exercise;Neuromuscular re-education;Patient/family education;Manual techniques;Manual lymph drainage;Passive range of motion;Dry needling;Energy conservation    PT Next Visit Plan  Continue with therex and education on postural awareness.  Check on radicular symptoms.  Begin wall washing with attention to elevation    PT Home Exercise Plan  initial - seated scalene stretch  3/20:  decompression 1-5    Consulted and Agree with Plan of Care  Patient       Patient will benefit from skilled therapeutic intervention in order to improve the following deficits and impairments:  Increased fascial restricitons, Improper body mechanics, Pain, Decreased mobility, Postural dysfunction, Decreased range of motion, Hypomobility, Impaired flexibility  Visit Diagnosis: Cervicalgia  Radiculopathy, cervical region  Acute midline low back pain without sciatica  Pain in right arm     Problem List There are no active problems to display for this patient.  Lurena Nida, PTA/CLT [phone]  Lurena Nida 06/18/2017, 11:14 AM  [facility] [address] [place], [place], [address] Phone: [phone]   Fax:  [phone]  Name: Miranda Rose MRN: [id number] Date of Birth: 1935-08-10

## 2017-06-20 ENCOUNTER — Ambulatory Visit (HOSPITAL_COMMUNITY): Payer: Medicare Other

## 2017-06-20 ENCOUNTER — Encounter (HOSPITAL_COMMUNITY): Payer: Self-pay

## 2017-06-20 DIAGNOSIS — M542 Cervicalgia: Secondary | ICD-10-CM | POA: Diagnosis not present

## 2017-06-20 DIAGNOSIS — M5412 Radiculopathy, cervical region: Secondary | ICD-10-CM

## 2017-06-20 DIAGNOSIS — M545 Low back pain, unspecified: Secondary | ICD-10-CM

## 2017-06-20 DIAGNOSIS — M79601 Pain in right arm: Secondary | ICD-10-CM

## 2017-06-20 NOTE — Therapy (Signed)
[facility] [address] [place], [place], [address] Phone: [phone]   Fax:  [phone]  Physical Therapy Treatment  Patient Details  Name: Miranda Rose MRN: [id number] Date of Birth: [birth year] Referring Provider: Norlene CampbellPeter Whitfield   Encounter Date: 06/20/2017  PT End of Session - 06/20/17 1043    Visit Number  6    Number of Visits  12    Date for PT Re-Evaluation  07/16/17 minireassess 06/26/2007    Authorization Type  co-pay $40.00 U[employer]; no deduct 1/1-12/31/19    Authorization Time Period  Cert 5/64-->3/32/95183/18-->07/16/2017    PT Start Time  1036 3' on UBE, no charge    PT Stop Time  1117    PT Time Calculation (min)  41 min    Activity Tolerance  Patient tolerated treatment well    Behavior During Therapy  [facility] for tasks assessed/performed       Past Medical History:  Diagnosis Date  . Hyperlipemia   . Hypertension     Past Surgical History:  Procedure Laterality Date  . COLONOSCOPY  09/15/2011   Procedure: COLONOSCOPY;  Surgeon: Corbin Adeobert M Rourk, MD;  [facility];  Service: Endoscopy;  Laterality: N/A;  8:15 AM    There were no vitals filed for this visit.  Subjective Assessment - 06/20/17 1041    Subjective  Pt stated her back is feeling good today.  Continues to have achey pain Rt shoulder.  Reports great relief following massage last session.      Patient Stated Goals  see if we can get rid of pain and see where it is coming from    Currently in Pain?  Yes    Pain Score  3     Pain Location  Neck    Pain Orientation  Right    Pain Descriptors / Indicators  Aching    Pain Type  Acute pain    Pain Radiating Towards  No radicuar symptoms today    Pain Onset  In the past 7 days    Pain Frequency  Intermittent    Aggravating Factors   activity    Pain Relieving Factors  not sure    Effect of Pain on Daily Activities  still does everything but with pain                       [facility] Adult PT  Treatment/Exercise - 06/20/17 0001      Neck Exercises: Machines for Strengthening   UBE (Upper Arm Bike)  3 minutes level 1 backward cueing to prevent shoulder elevation      Neck Exercises: Theraband   Scapula Retraction  10 reps;Green    Shoulder Extension  10 reps;Green    Rows  10 reps;Green      Neck Exercises: Standing   Wall Wash  1' BUE, cueing to prevent elevation    Other Standing Exercises  wall arch 10 reps     Other Standing Exercises  UE flexion against wall 10 reps      Neck Exercises: Supine   Neck Retraction  5 reps    Neck Retraction Limitations  max verbal and tactile cues      Manual Therapy   Manual Therapy  Soft tissue mobilization    Manual therapy comments  completed seperately from all other skilled interventions    Soft tissue mobilization  Rt cervical/UT region to decrease pain and spasm in sitting  PT Short Term Goals - 06/15/17 1118      PT SHORT TERM GOAL #1   Title  Patient will perform HEP independently.    Time  3    Period  Weeks    Status  On-going      PT SHORT TERM GOAL #2   Title  Patient will be able to sleep only waking 1 time to take pain medication.    Time  3    Period  Weeks    Status  On-going      PT SHORT TERM GOAL #3   Title  Patient will be able to perform normal cleaning activities with no greater than 5/10 pain.    Time  3    Period  Weeks    Status  On-going        PT Long Term Goals - 06/15/17 1119      PT LONG TERM GOAL #1   Title  Patient will be independent in advanced HEP to manage her neck, right arm and LBP pain.    Time  6    Period  Weeks    Status  On-going      PT LONG TERM GOAL #2   Title  Patient will be able to sleep through the night with pain medications.    Time  6    Period  Weeks    Status  On-going      PT LONG TERM GOAL #3   Title  Patient will be able to drive 2 hours to take a day trip without c/o neck/right arm pain.     Time  6    Period  Weeks     Status  On-going      PT LONG TERM GOAL #4   Title  Patient will report no > 2/10 pain in the last week.    Time  6    Period  Weeks    Status  On-going            Plan - 06/20/17 1319    Clinical Impression Statement  Session focus on postural strengthening.  Pt with tendency to elevate shoulders with scapular retraction exercises, moderate verbal and tactile cueing required for proper muscle activation.  EOS with soft tissue mobilization for pain control Rt shoulder, generalized tightness but no spasms palpated.  No reports of radicular symptoms and no pain at EOS.      Rehab Potential  Good    PT Frequency  2x / week    PT Duration  6 weeks    PT Treatment/Interventions  ADLs/Self Care Home Management;Electrical Stimulation;Cryotherapy;Moist Heat;Traction;Ultrasound;Therapeutic activities;Therapeutic exercise;Neuromuscular re-education;Patient/family education;Manual techniques;Manual lymph drainage;Passive range of motion;Dry needling;Energy conservation    PT Next Visit Plan  Continue with therex and education on postural awareness.  Check on radicular symptoms.     PT Home Exercise Plan  initial - seated scalene stretch  3/20:  decompression 1-5       Patient will benefit from skilled therapeutic intervention in order to improve the following deficits and impairments:  Increased fascial restricitons, Improper body mechanics, Pain, Decreased mobility, Postural dysfunction, Decreased range of motion, Hypomobility, Impaired flexibility  Visit Diagnosis: Cervicalgia  Radiculopathy, cervical region  Acute midline low back pain without sciatica  Pain in right arm     Problem List There are no active problems to display for this patient.  [address], LPTA; CBIS [phone]  Juel Burrow 06/20/2017, 1:25 PM  [facility] [address] [place], [place], [address] Phone: [phone]   Fax:  [phone]  Name:  Miranda Rose MRN: [id number] Date of Birth: 11/18/1935

## 2017-06-25 ENCOUNTER — Ambulatory Visit (HOSPITAL_COMMUNITY): Payer: Medicare Other

## 2017-06-25 ENCOUNTER — Encounter (HOSPITAL_COMMUNITY): Payer: Self-pay

## 2017-06-25 DIAGNOSIS — M5412 Radiculopathy, cervical region: Secondary | ICD-10-CM

## 2017-06-25 DIAGNOSIS — M542 Cervicalgia: Secondary | ICD-10-CM | POA: Diagnosis not present

## 2017-06-25 DIAGNOSIS — M545 Low back pain, unspecified: Secondary | ICD-10-CM

## 2017-06-25 DIAGNOSIS — M79601 Pain in right arm: Secondary | ICD-10-CM | POA: Diagnosis not present

## 2017-06-25 NOTE — Therapy (Signed)
[facility] [address] [place], [place], [address] Phone: [phone]   Fax:  [phone]  Physical Therapy Treatment/Reassessment  Patient Details  Name: Miranda Rose MRN: [id number] Date of Birth: 01-Feb-1936 Referring Provider: Norlene Campbell   Encounter Date: 06/25/2017  PT End of Session - 06/25/17 0949    Visit Number  7    Number of Visits  12    Date for PT Re-Evaluation  07/16/17    Authorization Type  co-pay $40.00 [employer]; no deduct 1/1-12/31/19    Authorization Time Period  Cert 7/42-->5/95/6387    PT Start Time  0945    PT Stop Time  1019    PT Time Calculation (min)  34 min    Activity Tolerance  Patient tolerated treatment well    Behavior During Therapy  [facility] for tasks assessed/performed       Past Medical History:  Diagnosis Date  . Hyperlipemia   . Hypertension     Past Surgical History:  Procedure Laterality Date  . COLONOSCOPY  09/15/2011   Procedure: COLONOSCOPY;  Surgeon: Corbin Ade, MD;  [facility];  Service: Endoscopy;  Laterality: N/A;  8:15 AM    There were no vitals filed for this visit.  Subjective Assessment - 06/25/17 0948    Subjective  Pt states that she is feeling much better overall. She inquired about if she needed to continue therapy for the rest of the month. States sometimes she can "feel it" but no pain currently and it is nowhere near as bad as it was.    Patient Stated Goals  see if we can get rid of pain and see where it is coming from    Currently in Pain?  No/denies    Pain Onset  In the past 7 days         [facility] PT Assessment - 06/25/17 0001      Assessment   Medical Diagnosis  DDD cervical/lumbar spine    Referring Provider  Norlene Campbell    Onset Date/Surgical Date  05/04/17    Next MD Visit  None yet             [facility] Adult PT Treatment/Exercise - 06/25/17 0001      Neck Exercises: Machines for Strengthening   UBE (Upper Arm Bike)  3 minutes  level 1 backward           PT Education - 06/25/17 0950    Education provided  Yes    Education Details  reassessment findings; updated HEP and educated on each exercise    Person(s) Educated  Patient    Methods  Explanation;Demonstration;Handout    Comprehension  Verbalized understanding;Returned demonstration       PT Short Term Goals - 06/25/17 0950      PT SHORT TERM GOAL #1   Title  Patient will perform HEP independently.    Baseline  4/8: reports compliance    Time  3    Period  Weeks    Status  Achieved      PT SHORT TERM GOAL #2   Title  Patient will be able to sleep only waking 1 time to take pain medication.    Baseline  4/8: pain does not wake her up at night    Time  3    Period  Weeks    Status  Achieved      PT SHORT TERM GOAL #3   Title  Patient  will be able to perform normal cleaning activities with no greater than 5/10 pain.    Baseline  4/8: no pain with cleaning    Time  3    Period  Weeks    Status  Achieved        PT Long Term Goals - 06/25/17 0950      PT LONG TERM GOAL #1   Title  Patient will be independent in advanced HEP to manage her neck, right arm and LBP pain.    Baseline  4/8: updated HEP this date    Time  6    Period  Weeks    Status  On-going      PT LONG TERM GOAL #2   Title  Patient will be able to sleep through the night with pain medications.    Time  6    Period  Weeks    Status  Achieved      PT LONG TERM GOAL #3   Title  Patient will be able to drive 2 hours to take a day trip without c/o neck/right arm pain.     Baseline  4/8: can drive to 2+ hours without neck pain     Time  6    Period  Weeks    Status  Achieved      PT LONG TERM GOAL #4   Title  Patient will report no > 2/10 pain in the last week.    Baseline  4/8: sometimes it's a 1, maybe a 2, but mostly a 0/10    Time  6    Period  Weeks    Status  Achieved            Plan - 06/25/17 1024    Clinical Impression Statement  PT reassessed pt's  goals this date. She has made tremendous improvements AEB achieving all but 1 goal, her advanced HEP goal. Pt had asked at beginning of therapy if she needed to come for the rest of the month. PT and pt discussed placing pt on advanced HEP POC for remainder of her POC to see how she manages it independently and if she needs to return, she can f/u on her 4/24 appointment. If she feels that her pain is still doing better and she can manage it on her own, she will call and cancel that appointment and be discharged at that time. PT updated pt's HEP to include postural strengthening and went over in detail with the pt who verbalized and demo'd understanding.    Rehab Potential  Good    PT Frequency  2x / week    PT Duration  6 weeks    PT Treatment/Interventions  ADLs/Self Care Home Management;Electrical Stimulation;Cryotherapy;Moist Heat;Traction;Ultrasound;Therapeutic activities;Therapeutic exercise;Neuromuscular re-education;Patient/family education;Manual techniques;Manual lymph drainage;Passive range of motion;Dry needling;Energy conservation    PT Next Visit Plan  reassess or discharge if pt calls to cancel appointment     PT Home Exercise Plan  initial - seated scalene stretch  3/20:  decompression 1-5; see below for detailed additions    Consulted and Agree with Plan of Care  Patient       Patient will benefit from skilled therapeutic intervention in order to improve the following deficits and impairments:  Increased fascial restricitons, Improper body mechanics, Pain, Decreased mobility, Postural dysfunction, Decreased range of motion, Hypomobility, Impaired flexibility  Visit Diagnosis: Cervicalgia  Radiculopathy, cervical region  Acute midline low back pain without sciatica  Pain in right arm  Problem List There are no active problems to display for this patient.      Jac CanavanBrooke Abdishakur Gottschall PT, DPT  [facility] [address] [place], [place], [address] Phone: [phone]   Fax:  [phone]  Name: Miranda Rose MRN: [id number] Date of Birth: 1936-03-20

## 2017-06-25 NOTE — Patient Instructions (Signed)
Access Code: [email or web address]  URL: [email or web address]  Date: 06/25/2017  Prepared by: Jac CanavanBrooke Sahalie Beth   Exercises  Scapular Retraction with Resistance - 10 reps - 3 sets - 1x daily - 7x weekly  Scapular Retraction with Resistance Advanced - 10 reps - 3 sets - 1x daily - 7x weekly  High Row Scapular Retraction with Compression Garment - 10 reps - 3 sets - 1x daily - 7x weekly  Low Trap Setting at Wall - 10 reps - 3 sets - 1x daily - 7x weekly  Standing Shoulder Flexion Wall Walk - 10 reps - 3 sets - 1x daily - 7x weekly  Seated Shoulder W - 10 reps - 3 sets - 1x daily - 7x weekly  Wall Push Up - 10 reps - 3 sets - 1x daily - 7x weekly

## 2017-06-27 ENCOUNTER — Encounter (HOSPITAL_COMMUNITY): Payer: Medicare Other

## 2017-06-28 DIAGNOSIS — E1129 Type 2 diabetes mellitus with other diabetic kidney complication: Secondary | ICD-10-CM | POA: Diagnosis not present

## 2017-06-28 DIAGNOSIS — E782 Mixed hyperlipidemia: Secondary | ICD-10-CM | POA: Diagnosis not present

## 2017-06-28 DIAGNOSIS — E119 Type 2 diabetes mellitus without complications: Secondary | ICD-10-CM | POA: Diagnosis not present

## 2017-06-28 DIAGNOSIS — I10 Essential (primary) hypertension: Secondary | ICD-10-CM | POA: Diagnosis not present

## 2017-07-02 ENCOUNTER — Encounter (HOSPITAL_COMMUNITY): Payer: Medicare Other

## 2017-07-04 ENCOUNTER — Encounter (HOSPITAL_COMMUNITY): Payer: Medicare Other

## 2017-07-09 ENCOUNTER — Encounter (HOSPITAL_COMMUNITY): Payer: Medicare Other

## 2017-07-10 ENCOUNTER — Telehealth (HOSPITAL_COMMUNITY): Payer: Self-pay

## 2017-07-10 NOTE — Telephone Encounter (Signed)
Patient called to say she is doing fine and please discharge her from rehab.

## 2017-07-11 ENCOUNTER — Encounter (HOSPITAL_COMMUNITY): Payer: Self-pay

## 2017-07-11 ENCOUNTER — Ambulatory Visit (HOSPITAL_COMMUNITY): Payer: Medicare Other

## 2017-07-11 NOTE — Therapy (Signed)
[place] [address] [place], [place], [address] Phone: [phone]   Fax:  [phone]  Patient Details  Name: Miranda Rose MRN: [id number] Date of Birth: [birth year] Referring Provider:  No ref. provider found  Encounter Date: 07/11/2017     Patient called on 07/10/17 to say she is doing fine and please discharge her from rehab.   PHYSICAL THERAPY DISCHARGE SUMMARY  Visits from Start of Care: 7  Current functional level related to goals / functional outcomes: See last treatmnet note   Remaining deficits: See last treatment note   Education / Equipment: See last treatment note Plan: Patient agrees to discharge.  Patient goals were met. Patient is being discharged due to meeting the stated rehab goals.  ?????       Geraldine Solar PT, [facility] [address] [place], [place], [address] Phone: [phone]   Fax:  [phone]

## 2018-01-10 DIAGNOSIS — E1129 Type 2 diabetes mellitus with other diabetic kidney complication: Secondary | ICD-10-CM | POA: Diagnosis not present

## 2018-01-10 DIAGNOSIS — Z0001 Encounter for general adult medical examination with abnormal findings: Secondary | ICD-10-CM | POA: Diagnosis not present

## 2018-01-10 DIAGNOSIS — Z1389 Encounter for screening for other disorder: Secondary | ICD-10-CM | POA: Diagnosis not present

## 2018-01-10 DIAGNOSIS — E782 Mixed hyperlipidemia: Secondary | ICD-10-CM | POA: Diagnosis not present

## 2018-01-10 DIAGNOSIS — Z23 Encounter for immunization: Secondary | ICD-10-CM | POA: Diagnosis not present

## 2018-01-10 DIAGNOSIS — I10 Essential (primary) hypertension: Secondary | ICD-10-CM | POA: Diagnosis not present

## 2018-01-10 DIAGNOSIS — L309 Dermatitis, unspecified: Secondary | ICD-10-CM | POA: Diagnosis not present

## 2018-08-29 DIAGNOSIS — I10 Essential (primary) hypertension: Secondary | ICD-10-CM | POA: Diagnosis not present

## 2018-08-29 DIAGNOSIS — E1129 Type 2 diabetes mellitus with other diabetic kidney complication: Secondary | ICD-10-CM | POA: Diagnosis not present

## 2018-08-29 DIAGNOSIS — M1991 Primary osteoarthritis, unspecified site: Secondary | ICD-10-CM | POA: Diagnosis not present

## 2018-08-29 DIAGNOSIS — Z1389 Encounter for screening for other disorder: Secondary | ICD-10-CM | POA: Diagnosis not present

## 2018-08-29 DIAGNOSIS — Z0001 Encounter for general adult medical examination with abnormal findings: Secondary | ICD-10-CM | POA: Diagnosis not present

## 2018-08-30 DIAGNOSIS — E1129 Type 2 diabetes mellitus with other diabetic kidney complication: Secondary | ICD-10-CM | POA: Diagnosis not present

## 2018-10-18 DIAGNOSIS — N182 Chronic kidney disease, stage 2 (mild): Secondary | ICD-10-CM | POA: Diagnosis not present

## 2018-10-18 DIAGNOSIS — Z1389 Encounter for screening for other disorder: Secondary | ICD-10-CM | POA: Diagnosis not present

## 2018-10-18 DIAGNOSIS — E7849 Other hyperlipidemia: Secondary | ICD-10-CM | POA: Diagnosis not present

## 2018-10-18 DIAGNOSIS — I10 Essential (primary) hypertension: Secondary | ICD-10-CM | POA: Diagnosis not present

## 2018-12-05 DIAGNOSIS — L308 Other specified dermatitis: Secondary | ICD-10-CM | POA: Diagnosis not present

## 2018-12-27 DIAGNOSIS — I10 Essential (primary) hypertension: Secondary | ICD-10-CM | POA: Diagnosis not present

## 2018-12-27 DIAGNOSIS — Z23 Encounter for immunization: Secondary | ICD-10-CM | POA: Diagnosis not present

## 2018-12-27 DIAGNOSIS — E7849 Other hyperlipidemia: Secondary | ICD-10-CM | POA: Diagnosis not present

## 2018-12-27 DIAGNOSIS — E1129 Type 2 diabetes mellitus with other diabetic kidney complication: Secondary | ICD-10-CM | POA: Diagnosis not present

## 2019-01-01 DIAGNOSIS — I10 Essential (primary) hypertension: Secondary | ICD-10-CM | POA: Diagnosis not present

## 2019-01-01 DIAGNOSIS — E119 Type 2 diabetes mellitus without complications: Secondary | ICD-10-CM | POA: Diagnosis not present

## 2019-01-01 DIAGNOSIS — E7849 Other hyperlipidemia: Secondary | ICD-10-CM | POA: Diagnosis not present

## 2019-01-01 DIAGNOSIS — Z1389 Encounter for screening for other disorder: Secondary | ICD-10-CM | POA: Diagnosis not present

## 2019-09-19 DIAGNOSIS — H9190 Unspecified hearing loss, unspecified ear: Secondary | ICD-10-CM | POA: Diagnosis not present

## 2019-09-19 DIAGNOSIS — Z0000 Encounter for general adult medical examination without abnormal findings: Secondary | ICD-10-CM | POA: Diagnosis not present

## 2019-09-19 DIAGNOSIS — I10 Essential (primary) hypertension: Secondary | ICD-10-CM | POA: Diagnosis not present

## 2019-09-19 DIAGNOSIS — E1129 Type 2 diabetes mellitus with other diabetic kidney complication: Secondary | ICD-10-CM | POA: Diagnosis not present

## 2019-09-19 DIAGNOSIS — E7849 Other hyperlipidemia: Secondary | ICD-10-CM | POA: Diagnosis not present

## 2019-09-19 DIAGNOSIS — Z1389 Encounter for screening for other disorder: Secondary | ICD-10-CM | POA: Diagnosis not present

## 2019-09-30 DIAGNOSIS — Z0000 Encounter for general adult medical examination without abnormal findings: Secondary | ICD-10-CM | POA: Diagnosis not present

## 2019-09-30 DIAGNOSIS — E7849 Other hyperlipidemia: Secondary | ICD-10-CM | POA: Diagnosis not present
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
## 2019-11-13 DIAGNOSIS — E1129 Type 2 diabetes mellitus with other diabetic kidney complication: Secondary | ICD-10-CM | POA: Diagnosis not present

## 2019-11-13 DIAGNOSIS — E7849 Other hyperlipidemia: Secondary | ICD-10-CM | POA: Diagnosis not present

## 2019-11-13 DIAGNOSIS — N182 Chronic kidney disease, stage 2 (mild): Secondary | ICD-10-CM | POA: Diagnosis not present

## 2019-11-13 DIAGNOSIS — I10 Essential (primary) hypertension: Secondary | ICD-10-CM | POA: Diagnosis not present

## 2019-11-21 DIAGNOSIS — I10 Essential (primary) hypertension: Secondary | ICD-10-CM | POA: Diagnosis not present

## 2019-11-21 DIAGNOSIS — E1129 Type 2 diabetes mellitus with other diabetic kidney complication: Secondary | ICD-10-CM | POA: Diagnosis not present

## 2019-11-21 DIAGNOSIS — N182 Chronic kidney disease, stage 2 (mild): Secondary | ICD-10-CM | POA: Diagnosis not present

## 2019-11-21 DIAGNOSIS — E7849 Other hyperlipidemia: Secondary | ICD-10-CM | POA: Diagnosis not present

## 2020-01-08 DIAGNOSIS — E1129 Type 2 diabetes mellitus with other diabetic kidney complication: Secondary | ICD-10-CM | POA: Diagnosis not present

## 2020-01-08 DIAGNOSIS — Z23 Encounter for immunization: Secondary | ICD-10-CM | POA: Diagnosis not present

## 2020-01-08 DIAGNOSIS — I10 Essential (primary) hypertension: Secondary | ICD-10-CM | POA: Diagnosis not present

## 2020-01-08 DIAGNOSIS — E7849 Other hyperlipidemia: Secondary | ICD-10-CM | POA: Diagnosis not present

## 2020-01-08 DIAGNOSIS — E119 Type 2 diabetes mellitus without complications: Secondary | ICD-10-CM | POA: Diagnosis not present

## 2020-04-30 ENCOUNTER — Other Ambulatory Visit (HOSPITAL_COMMUNITY): Payer: Self-pay | Admitting: Family Medicine

## 2020-04-30 DIAGNOSIS — E2839 Other primary ovarian failure: Secondary | ICD-10-CM

## 2020-04-30 DIAGNOSIS — E1129 Type 2 diabetes mellitus with other diabetic kidney complication: Secondary | ICD-10-CM | POA: Diagnosis not present

## 2020-04-30 DIAGNOSIS — N182 Chronic kidney disease, stage 2 (mild): Secondary | ICD-10-CM | POA: Diagnosis not present

## 2020-04-30 DIAGNOSIS — I10 Essential (primary) hypertension: Secondary | ICD-10-CM | POA: Diagnosis not present

## 2020-04-30 DIAGNOSIS — E7849 Other hyperlipidemia: Secondary | ICD-10-CM | POA: Diagnosis not present

## 2020-04-30 DIAGNOSIS — E119 Type 2 diabetes mellitus without complications: Secondary | ICD-10-CM | POA: Diagnosis not present

## 2020-06-21 DIAGNOSIS — H6992 Unspecified Eustachian tube disorder, left ear: Secondary | ICD-10-CM | POA: Diagnosis not present

## 2020-07-02 ENCOUNTER — Emergency Department (HOSPITAL_COMMUNITY): Payer: Medicare Other

## 2020-07-02 ENCOUNTER — Emergency Department (HOSPITAL_COMMUNITY)
Admission: EM | Admit: 2020-07-02 | Discharge: 2020-07-02 | Disposition: A | Discharge status: Home / Self Care | Payer: Medicare Other | Attending: Emergency Medicine | Admitting: Emergency Medicine

## 2020-07-02 ENCOUNTER — Encounter (HOSPITAL_COMMUNITY): Payer: Self-pay | Admitting: Emergency Medicine

## 2020-07-02 ENCOUNTER — Other Ambulatory Visit: Payer: Self-pay

## 2020-07-02 DIAGNOSIS — R109 Unspecified abdominal pain: Secondary | ICD-10-CM | POA: Diagnosis not present

## 2020-07-02 DIAGNOSIS — R197 Diarrhea, unspecified: Secondary | ICD-10-CM | POA: Diagnosis not present

## 2020-07-02 DIAGNOSIS — Z79899 Other long term (current) drug therapy: Secondary | ICD-10-CM | POA: Diagnosis not present

## 2020-07-02 DIAGNOSIS — Z7982 Long term (current) use of aspirin: Secondary | ICD-10-CM | POA: Diagnosis not present

## 2020-07-02 DIAGNOSIS — A09 Infectious gastroenteritis and colitis, unspecified: Secondary | ICD-10-CM | POA: Diagnosis not present

## 2020-07-02 DIAGNOSIS — I10 Essential (primary) hypertension: Secondary | ICD-10-CM | POA: Insufficient documentation

## 2020-07-02 LAB — COMPREHENSIVE METABOLIC PANEL
ALT: 28 U/L (ref 0–44)
AST: 25 U/L (ref 15–41)
Albumin: 4.2 g/dL (ref 3.5–5.0)
Alkaline Phosphatase: 77 U/L (ref 38–126)
Anion gap: 9 (ref 5–15)
BUN: 12 mg/dL (ref 8–23)
CO2: 26 mmol/L (ref 22–32)
Calcium: 8.8 mg/dL — ABNORMAL LOW (ref 8.9–10.3)
Chloride: 104 mmol/L (ref 98–111)
Creatinine, Ser: 1.02 mg/dL — ABNORMAL HIGH (ref 0.44–1.00)
GFR, Estimated: 55 mL/min — ABNORMAL LOW (ref >60)
Glucose, Bld: 147 mg/dL — ABNORMAL HIGH (ref 70–99)
Potassium: 4.6 mmol/L (ref 3.5–5.1)
Sodium: 139 mmol/L (ref 135–145)
Total Bilirubin: 0.9 mg/dL (ref 0.3–1.2)
Total Protein: 7.4 g/dL (ref 6.5–8.1)

## 2020-07-02 LAB — CBC WITH DIFFERENTIAL/PLATELET
Abs Immature Granulocytes: 0.03 10*3/uL (ref 0.00–0.07)
Basophils Absolute: 0 10*3/uL (ref 0.0–0.1)
Basophils Relative: 0 %
Eosinophils Absolute: 0 10*3/uL (ref 0.0–0.5)
Eosinophils Relative: 0 %
HCT: 42.1 % (ref 36.0–46.0)
Hemoglobin: 14.2 g/dL (ref 12.0–15.0)
Immature Granulocytes: 0 %
Lymphocytes Relative: 15 %
Lymphs Abs: 1.8 10*3/uL (ref 0.7–4.0)
MCH: 35.3 pg — ABNORMAL HIGH (ref 26.0–34.0)
MCHC: 33.7 g/dL (ref 30.0–36.0)
MCV: 104.7 fL — ABNORMAL HIGH (ref 80.0–100.0)
Monocytes Absolute: 0.7 10*3/uL (ref 0.1–1.0)
Monocytes Relative: 6 %
Neutro Abs: 9.4 10*3/uL — ABNORMAL HIGH (ref 1.7–7.7)
Neutrophils Relative %: 79 %
Platelets: 256 10*3/uL (ref 150–400)
RBC: 4.02 MIL/uL (ref 3.87–5.11)
RDW: 13.2 % (ref 11.5–15.5)
WBC: 11.9 10*3/uL — ABNORMAL HIGH (ref 4.0–10.5)
nRBC: 0 % (ref 0.0–0.2)

## 2020-07-02 MED ORDER — SODIUM CHLORIDE 0.9 % IV BOLUS: 500.0000 mL | Freq: Once | INTRAVENOUS | Status: DC

## 2020-07-02 MED ORDER — LOPERAMIDE HCL 2 MG PO CAPS
4.0000 mg | ORAL_CAPSULE | Freq: Once | ORAL | Status: AC
  Administered 2020-07-02: 4 mg via ORAL
  Filled 2020-07-02: qty 2

## 2020-07-02 NOTE — ED Notes (Signed)
Patient denies pain and is resting comfortably.  

## 2020-07-02 NOTE — ED Provider Notes (Signed)
[facility] EMERGENCY DEPARTMENT Provider Note   CSN: [id number] Arrival date & time: 07/02/20  7673     History Chief Complaint  Patient presents with  . Diarrhea    Miranda Rose is a 85 y.o. female.   Patient complains of some diarrhea no vomiting no fever no abdominal cramps except when she has a diarrhea  The history is provided by the patient and medical records. No language interpreter was used.  Diarrhea Quality:  Semi-solid Severity:  Mild Onset quality:  Sudden Timing:  Constant Progression:  Unchanged Relieved by:  Nothing Worsened by:  Nothing Ineffective treatments:  None tried Associated symptoms: abdominal pain   Associated symptoms: no headaches        Past Medical History:  Diagnosis Date  . Hyperlipemia   . Hypertension     There are no problems to display for this patient.   Past Surgical History:  Procedure Laterality Date  . COLONOSCOPY  09/15/2011   Procedure: COLONOSCOPY;  Surgeon: Corbin Ade, MD;  Location: [facility]ITE;  Service: Endoscopy;  Laterality: N/A;  8:15 AM     OB History   No obstetric history on file.     History reviewed. No pertinent family history.  Social History   Tobacco Use  . Smoking status: Never Smoker  . Smokeless tobacco: Never Used  Substance Use Topics  . Alcohol use: No  . Drug use: No    Home Medications Prior to Admission medications   Medication Sig Start Date End Date Taking? Authorizing Provider  amLODipine (NORVASC) 5 MG tablet Take 1 tablet by mouth daily. 04/30/20  Yes [provider]  aspirin EC 81 MG tablet Take 81 mg by mouth daily.   Yes [provider]  atorvastatin (LIPITOR) 80 MG tablet Take 1 tablet by mouth daily. 04/30/20  Yes [provider]  diazepam (VALIUM) 2 MG tablet Take 1 tablet (2 mg total) by mouth every 8 (eight) hours as needed for muscle spasms (spasms). Patient not taking: No sig reported 05/19/17   Mancel Bale, MD  predniSONE  (DELTASONE) 20 MG tablet Take 1 tablet (20 mg total) by mouth 2 (two) times daily. Patient not taking: No sig reported 05/19/17   Mancel Bale, MD    Allergies    Glucophage [metformin hydrochloride]  Review of Systems   Review of Systems  Constitutional: Negative for appetite change and fatigue.  HENT: Negative for congestion, ear discharge and sinus pressure.   Eyes: Negative for discharge.  Respiratory: Negative for cough.   Cardiovascular: Negative for chest pain.  Gastrointestinal: Positive for abdominal pain and diarrhea.  Genitourinary: Negative for frequency and hematuria.  Musculoskeletal: Negative for back pain.  Skin: Negative for rash.  Neurological: Negative for seizures and headaches.  Psychiatric/Behavioral: Negative for hallucinations.    Physical Exam Updated Vital Signs BP (!) 131/57   Pulse 80   Temp 98.6 F (37 C) (Oral)   Resp 14   Ht 5\' 1"  (1.549 m)   Wt 65.3 kg   SpO2 99%   BMI 27.21 kg/m   Physical Exam Vitals reviewed.  Constitutional:      Appearance: She is well-developed.  HENT:     Head: Normocephalic.     Mouth/Throat:     Mouth: Mucous membranes are moist.  Eyes:     General: No scleral icterus.    Conjunctiva/sclera: Conjunctivae normal.  Neck:     Thyroid: No thyromegaly.  Cardiovascular:     Rate and  Rhythm: Normal rate and regular rhythm.     Heart sounds: No murmur heard. No friction rub. No gallop.   Pulmonary:     Breath sounds: No stridor. No wheezing or rales.  Chest:     Chest wall: No tenderness.  Abdominal:     General: There is no distension.     Tenderness: There is no abdominal tenderness. There is no rebound.  Musculoskeletal:        General: Normal range of motion.     Cervical back: Neck supple.  Lymphadenopathy:     Cervical: No cervical adenopathy.  Skin:    Findings: No erythema or rash.  Neurological:     Mental Status: She is alert and oriented to person, place, and time.     Motor: No abnormal  muscle tone.     Coordination: Coordination normal.  Psychiatric:        Behavior: Behavior normal.     ED Results / Procedures / Treatments   Labs (all labs ordered are listed, but only abnormal results are displayed) Labs Reviewed  CBC WITH DIFFERENTIAL/PLATELET - Abnormal; Notable for the following components:      Result Value   WBC 11.9 (*)    MCV 104.7 (*)    MCH 35.3 (*)    Neutro Abs 9.4 (*)    All other components within normal limits  COMPREHENSIVE METABOLIC PANEL - Abnormal; Notable for the following components:   Glucose, Bld 147 (*)    Creatinine, Ser 1.02 (*)    Calcium 8.8 (*)    GFR, Estimated 55 (*)    All other components within normal limits    EKG None  Radiology DG ABD ACUTE 2+V W 1V CHEST  Result Date: 07/02/2020 CLINICAL DATA:  Diarrhea for several days worsening today, hypertension EXAM: DG ABDOMEN ACUTE WITH 1 VIEW CHEST COMPARISON:  None FINDINGS: Normal heart size, mediastinal contours, and pulmonary vascularity. Lungs clear. No infiltrate, pleural effusion, or pneumothorax. Nonobstructive bowel gas pattern. No bowel dilatation, bowel wall thickening, or free air. Degenerative changes of the hips and of facet joints at the lower lumbar spine. No urinary tract calcifications. Small pelvic phleboliths noted. IMPRESSION: No acute abnormalities. Electronically Signed   By: Ulyses Southward M.D.   On: 07/02/2020 08:35    Procedures Procedures   Medications Ordered in ED Medications  sodium chloride 0.9 % bolus 500 mL (500 mLs Intravenous Not Given 07/02/20 0955)  loperamide (IMODIUM) capsule 4 mg (4 mg Oral Given 07/02/20 0759)    ED Course  I have reviewed the triage vital signs and the nursing notes.  Pertinent labs & imaging results that were available during my care of the patient were reviewed by me and considered in my medical decision making (see chart for details).    MDM Rules/Calculators/A&P                          Patient with mild  diarrhea.  Patient has no fever minimal elevated white count.  She will be treated with just fluids Tylenol and possible Imodium and follow-up with PCP if not getting better Final Clinical Impression(s) / ED Diagnoses Final diagnoses:  Diarrhea of infectious origin    Rx / DC Orders ED Discharge Orders    None       Bethann Berkshire, MD 07/02/20 1109

## 2020-07-02 NOTE — ED Notes (Signed)
Pt took imodium at 2am.

## 2020-07-02 NOTE — ED Triage Notes (Signed)
Pt c/o of diarrhea x 2 days with abdominal pain

## 2020-07-02 NOTE — Discharge Instructions (Addendum)
Drink plenty of fluids and get some Imodium at the drugstore to take for diarrhea.  Return if developing a fever or persistent vomiting.  Follow-up with your doctor

## 2020-07-02 NOTE — ED Notes (Signed)
Pt is a hard stick 

## 2020-07-02 NOTE — ED Notes (Signed)
Fluids offered to pt 

## 2020-10-28 DIAGNOSIS — E782 Mixed hyperlipidemia: Secondary | ICD-10-CM | POA: Diagnosis not present

## 2020-10-28 DIAGNOSIS — I10 Essential (primary) hypertension: Secondary | ICD-10-CM | POA: Diagnosis not present

## 2020-10-28 DIAGNOSIS — Z1389 Encounter for screening for other disorder: Secondary | ICD-10-CM | POA: Diagnosis not present

## 2020-10-28 DIAGNOSIS — E1129 Type 2 diabetes mellitus with other diabetic kidney complication: Secondary | ICD-10-CM | POA: Diagnosis not present

## 2020-10-28 DIAGNOSIS — Z0001 Encounter for general adult medical examination with abnormal findings: Secondary | ICD-10-CM | POA: Diagnosis not present

## 2020-11-25 DIAGNOSIS — E119 Type 2 diabetes mellitus without complications: Secondary | ICD-10-CM | POA: Diagnosis not present

## 2021-02-03 DIAGNOSIS — Z23 Encounter for immunization: Secondary | ICD-10-CM | POA: Diagnosis not present

## 2021-02-03 DIAGNOSIS — I10 Essential (primary) hypertension: Secondary | ICD-10-CM | POA: Diagnosis not present

## 2021-02-03 DIAGNOSIS — N182 Chronic kidney disease, stage 2 (mild): Secondary | ICD-10-CM | POA: Diagnosis not present

## 2021-02-03 DIAGNOSIS — E782 Mixed hyperlipidemia: Secondary | ICD-10-CM | POA: Diagnosis not present

## 2021-02-03 DIAGNOSIS — E1129 Type 2 diabetes mellitus with other diabetic kidney complication: Secondary | ICD-10-CM | POA: Diagnosis not present

## 2021-05-12 DIAGNOSIS — E1129 Type 2 diabetes mellitus with other diabetic kidney complication: Secondary | ICD-10-CM | POA: Diagnosis not present

## 2021-05-12 DIAGNOSIS — H9312 Tinnitus, left ear: Secondary | ICD-10-CM | POA: Diagnosis not present

## 2021-07-04 DIAGNOSIS — H40013 Open angle with borderline findings, low risk, bilateral: Secondary | ICD-10-CM | POA: Diagnosis not present

## 2021-07-04 DIAGNOSIS — H25813 Combined forms of age-related cataract, bilateral: Secondary | ICD-10-CM | POA: Diagnosis not present

## 2021-07-04 DIAGNOSIS — H01001 Unspecified blepharitis right upper eyelid: Secondary | ICD-10-CM | POA: Diagnosis not present

## 2021-07-04 DIAGNOSIS — H52223 Regular astigmatism, bilateral: Secondary | ICD-10-CM | POA: Diagnosis not present

## 2021-07-04 DIAGNOSIS — H01005 Unspecified blepharitis left lower eyelid: Secondary | ICD-10-CM | POA: Diagnosis not present

## 2021-07-04 DIAGNOSIS — H01004 Unspecified blepharitis left upper eyelid: Secondary | ICD-10-CM | POA: Diagnosis not present

## 2021-07-04 DIAGNOSIS — H01002 Unspecified blepharitis right lower eyelid: Secondary | ICD-10-CM | POA: Diagnosis not present

## 2021-07-25 ENCOUNTER — Encounter: Payer: Self-pay | Admitting: *Deleted

## 2021-08-22 DIAGNOSIS — H25812 Combined forms of age-related cataract, left eye: Secondary | ICD-10-CM | POA: Diagnosis not present

## 2021-08-23 ENCOUNTER — Encounter (HOSPITAL_COMMUNITY)
Admission: RE | Admit: 2021-08-23 | Discharge: 2021-08-23 | Disposition: A | Discharge status: Home / Self Care | Payer: Medicare Other | Source: Ambulatory Visit | Attending: Ophthalmology | Admitting: Ophthalmology

## 2021-08-23 DIAGNOSIS — I10 Essential (primary) hypertension: Secondary | ICD-10-CM

## 2021-08-25 NOTE — H&P (Signed)
Surgical History & Physical  Patient Name: Miranda Rose DOB: 12/06/1935  Surgery: Cataract extraction with intraocular lens implant phacoemulsification; Left Eye  Surgeon: Baruch Goldmann MD Surgery Date:  08-29-21 Pre-Op Date:  08-22-21  HPI: A 60 Yr. old female patient is referred by Dr Jorja Loa for cataract eval 1. The patient complains of difficulty when blurry vision, which began 5 months ago. The left eye is affected. The episode is gradual. Symptoms occur when the patient is inside and outside. The complaint is associated with glare. Patient has trouble reading up close, especially in dim light. . This is negatively affecting the patient's quality of life and the patient is unable to function adequately in life with the current level of vision. HPI was performed by Baruch Goldmann .  Medical History: Cataracts High Blood Pressure LDL  Review of Systems Negative Allergic/Immunologic Negative Cardiovascular Negative Constitutional Negative Ear, Nose, Mouth & Throat Negative Endocrine Negative Eyes Negative Gastrointestinal Negative Genitourinary Negative Hemotologic/Lymphatic Negative Integumentary Negative Musculoskeletal Negative Neurological Negative Psychiatry Negative Respiratory  Social   Never smoked    Medication  Blood pressure medication (unknown), Cholesterol medication (unknown name),   Sx/Procedures   None  Drug Allergies   NKDA  History & Physical: Heent:Cataract, left eye NECK: supple without bruits LUNGS: lungs clear to auscultation CV: regular rate and rhythm Abdomen: soft and non-tender Impression & Plan: Assessment: 1.  COMBINED FORMS AGE RELATED CATARACT; Both Eyes (H25.813) 2.  BLEPHARITIS; Right Upper Lid, Right Lower Lid, Left Upper Lid, Left Lower Lid (H01.001, H01.002,H01.004,H01.005) 3.  ASTIGMATISM, REGULAR; Both Eyes (H52.223) 4.  OAG BORDERLINE FINDINGS LOW RISK; Both Eyes (H40.013)  Plan: 1.  Cataract accounts for the patient's  decreased vision. This visual impairment is not correctable with a tolerable change in glasses or contact lenses. Cataract surgery with an implantation of a new lens should significantly improve the visual and functional status of the patient. Discussed all risks, benefits, alternatives, and potential complications. Discussed the procedures and recovery. Patient desires to have surgery. A-scan ordered and performed today for intra-ocular lens calculations. The surgery will be performed in order to improve vision for driving, reading, and for eye examinations. Recommend phacoemulsification with intra-ocular lens. Recommend Dextenza for post-operative pain and inflammation. Left Eye worse - first.. Dilates poorly - shugarcaine by protocol. Malyugin Ring. Omidira. Toric Lens.  2.  recommend regular lid cleaning.  3.  Recommend toric IOL.  4.  Based on cup-to-disc ratio. Negative Family history. OCT rNFL shows: WNL OU IOP WNL OU Detailed discussion about glaucoma today including importance of maintaining good follow up and following treatment plan, and the possibility of irreversible blindness as part of this disease process.

## 2021-08-29 ENCOUNTER — Ambulatory Visit (HOSPITAL_BASED_OUTPATIENT_CLINIC_OR_DEPARTMENT_OTHER): Payer: Medicare Other | Admitting: Anesthesiology

## 2021-08-29 ENCOUNTER — Encounter (HOSPITAL_COMMUNITY)
Admission: RE | Disposition: A | Discharge status: Home / Self Care | Payer: Self-pay | Source: Home / Self Care | Attending: Ophthalmology

## 2021-08-29 ENCOUNTER — Encounter (HOSPITAL_COMMUNITY): Payer: Self-pay | Admitting: Ophthalmology

## 2021-08-29 ENCOUNTER — Ambulatory Visit (HOSPITAL_COMMUNITY): Payer: Medicare Other | Admitting: Anesthesiology

## 2021-08-29 ENCOUNTER — Ambulatory Visit (HOSPITAL_COMMUNITY)
Admission: RE | Admit: 2021-08-29 | Discharge: 2021-08-29 | Disposition: A | Discharge status: Home / Self Care | Payer: Medicare Other | Attending: Ophthalmology | Admitting: Ophthalmology

## 2021-08-29 ENCOUNTER — Other Ambulatory Visit: Payer: Self-pay

## 2021-08-29 DIAGNOSIS — H0100B Unspecified blepharitis left eye, upper and lower eyelids: Secondary | ICD-10-CM | POA: Diagnosis not present

## 2021-08-29 DIAGNOSIS — H40013 Open angle with borderline findings, low risk, bilateral: Secondary | ICD-10-CM | POA: Diagnosis not present

## 2021-08-29 DIAGNOSIS — H0100A Unspecified blepharitis right eye, upper and lower eyelids: Secondary | ICD-10-CM | POA: Diagnosis not present

## 2021-08-29 DIAGNOSIS — H52223 Regular astigmatism, bilateral: Secondary | ICD-10-CM | POA: Insufficient documentation

## 2021-08-29 DIAGNOSIS — H25812 Combined forms of age-related cataract, left eye: Secondary | ICD-10-CM | POA: Diagnosis not present

## 2021-08-29 DIAGNOSIS — I10 Essential (primary) hypertension: Secondary | ICD-10-CM

## 2021-08-29 HISTORY — PX: CATARACT EXTRACTION W/PHACO: SHX586

## 2021-08-29 SURGERY — PHACOEMULSIFICATION, CATARACT, WITH IOL INSERTION
Anesthesia: Monitor Anesthesia Care | Site: Eye | Laterality: Left

## 2021-08-29 MED ORDER — STERILE WATER FOR IRRIGATION IR SOLN
Status: DC | PRN
  Administered 2021-08-29: 250 mL

## 2021-08-29 MED ORDER — TRYPAN BLUE 0.06 % IO SOSY
PREFILLED_SYRINGE | INTRAOCULAR | Status: AC
  Filled 2021-08-29: qty 0.5

## 2021-08-29 MED ORDER — TETRACAINE HCL 0.5 % OP SOLN
1.0000 [drp] | OPHTHALMIC | Status: AC | PRN
  Administered 2021-08-29 (×3): 1 [drp] via OPHTHALMIC

## 2021-08-29 MED ORDER — POVIDONE-IODINE 5 % OP SOLN
OPHTHALMIC | Status: DC | PRN
  Administered 2021-08-29: 1 via OPHTHALMIC

## 2021-08-29 MED ORDER — EPINEPHRINE PF 1 MG/ML IJ SOLN
INTRAMUSCULAR | Status: AC
  Filled 2021-08-29: qty 2

## 2021-08-29 MED ORDER — TROPICAMIDE 1 % OP SOLN
1.0000 [drp] | OPHTHALMIC | Status: AC | PRN
Start: 2021-08-29 — End: 2021-08-29
  Administered 2021-08-29 (×3): 1 [drp] via OPHTHALMIC

## 2021-08-29 MED ORDER — SODIUM HYALURONATE 10 MG/ML IO SOLUTION
PREFILLED_SYRINGE | INTRAOCULAR | Status: DC | PRN
  Administered 2021-08-29: 0.85 mL via INTRAOCULAR

## 2021-08-29 MED ORDER — LIDOCAINE HCL (PF) 1 % IJ SOLN
INTRAOCULAR | Status: DC | PRN
  Administered 2021-08-29: 1 mL via OPHTHALMIC

## 2021-08-29 MED ORDER — BSS IO SOLN
INTRAOCULAR | Status: DC | PRN
  Administered 2021-08-29: 15 mL via INTRAOCULAR

## 2021-08-29 MED ORDER — PHENYLEPHRINE HCL 2.5 % OP SOLN
1.0000 [drp] | OPHTHALMIC | Status: AC | PRN
  Administered 2021-08-29 (×3): 1 [drp] via OPHTHALMIC

## 2021-08-29 MED ORDER — NEOMYCIN-POLYMYXIN-DEXAMETH 3.5-10000-0.1 OP SUSP
OPHTHALMIC | Status: DC | PRN
  Administered 2021-08-29: 1 [drp] via OPHTHALMIC

## 2021-08-29 MED ORDER — PHENYLEPHRINE-KETOROLAC 1-0.3 % IO SOLN
INTRAOCULAR | Status: AC
  Filled 2021-08-29: qty 4

## 2021-08-29 MED ORDER — SODIUM HYALURONATE 23MG/ML IO SOSY
PREFILLED_SYRINGE | INTRAOCULAR | Status: DC | PRN
  Administered 2021-08-29: 0.6 mL via INTRAOCULAR

## 2021-08-29 MED ORDER — LIDOCAINE HCL 3.5 % OP GEL
1.0000 "application " | Freq: Once | OPHTHALMIC | Status: AC
  Administered 2021-08-29: 1 via OPHTHALMIC

## 2021-08-29 MED ORDER — PHENYLEPHRINE-KETOROLAC 1-0.3 % IO SOLN
INTRAOCULAR | Status: DC | PRN
  Administered 2021-08-29: 500 mL via OPHTHALMIC

## 2021-08-29 MED ORDER — TRYPAN BLUE 0.06 % IO SOSY
PREFILLED_SYRINGE | INTRAOCULAR | Status: DC | PRN
  Administered 2021-08-29: 0.5 mL via INTRAOCULAR

## 2021-08-29 SURGICAL SUPPLY — 13 items
CATARACT SUITE SIGHTPATH (MISCELLANEOUS) ×2 IMPLANT
CLOTH BEACON ORANGE TIMEOUT ST (SAFETY) ×2 IMPLANT
EYE SHIELD UNIVERSAL CLEAR (GAUZE/BANDAGES/DRESSINGS) ×1 IMPLANT
FEE CATARACT SUITE SIGHTPATH (MISCELLANEOUS) ×1 IMPLANT
GLOVE BIOGEL PI IND STRL 7.0 (GLOVE) ×2 IMPLANT
GLOVE BIOGEL PI INDICATOR 7.0 (GLOVE) ×2
LENS IOL RAYNER 15.0 (Intraocular Lens) ×2 IMPLANT
LENS IOL RAYONE EMV 15.0 (Intraocular Lens) IMPLANT
PAD ARMBOARD 7.5X6 YLW CONV (MISCELLANEOUS) ×2 IMPLANT
SYR TB 1ML LL NO SAFETY (SYRINGE) ×2 IMPLANT
TAPE SURG TRANSPORE 1 IN (GAUZE/BANDAGES/DRESSINGS) IMPLANT
TAPE SURGICAL TRANSPORE 1 IN (GAUZE/BANDAGES/DRESSINGS) ×2
WATER STERILE IRR 250ML POUR (IV SOLUTION) ×2 IMPLANT

## 2021-08-29 NOTE — Anesthesia Procedure Notes (Signed)
Procedure Name: MAC Date/Time: 08/29/2021 2:18 PM  Performed by: Orlie Dakin, CRNAPre-anesthesia Checklist: Patient identified, Emergency Drugs available, Suction available and Patient being monitored Patient Re-evaluated:Patient Re-evaluated prior to induction Oxygen Delivery Method: Nasal cannula Placement Confirmation: positive ETCO2

## 2021-08-29 NOTE — Transfer of Care (Signed)
Immediate Anesthesia Transfer of Care Note  Patient: Miranda Rose  Procedure(s) Performed: CATARACT EXTRACTION PHACO AND INTRAOCULAR LENS PLACEMENT (IOC) (Left: Eye)  Patient Location: Short Stay  Anesthesia Type:MAC  Level of Consciousness: awake, alert  and oriented  Airway & Oxygen Therapy: Patient Spontanous Breathing  Post-op Assessment: Report given to RN and Post -op Vital signs reviewed and stable  Post vital signs: Reviewed and stable  Last Vitals:  Vitals Value Taken Time  BP    Temp    Pulse    Resp    SpO2      Last Pain:  Vitals:   08/29/21 1306  TempSrc: Oral  PainSc: 0-No pain         Complications: No notable events documented.

## 2021-08-29 NOTE — Interval H&P Note (Signed)
History and Physical Interval Note:  08/29/2021 1:57 PM  Miranda Rose  has presented today for surgery, with the diagnosis of combined forms age related cataract; left.  The various methods of treatment have been discussed with the patient and family. After consideration of risks, benefits and other options for treatment, the patient has consented to  Procedure(s) with comments: CATARACT EXTRACTION PHACO AND INTRAOCULAR LENS PLACEMENT (IOC) (Left) - left as a surgical intervention.  The patient's history has been reviewed, patient examined, no change in status, stable for surgery.  I have reviewed the patient's chart and labs.  Questions were answered to the patient's satisfaction.     Fabio Pierce

## 2021-08-29 NOTE — Op Note (Signed)
Date of procedure: 08/29/21  Pre-operative diagnosis: Visually significant age-related combined cataract, Left Eye (H25.812)  Post-operative diagnosis: Visually significant age-related combined cataract, Left Eye (H25.812)  Procedure: Removal of cataract via phacoemulsification and insertion of intra-ocular lens Rayner [id number] +15.0D into the capsular bag of the Left Eye  Attending surgeon: Gerda Diss. Haylee Mcanany, MD, MA  Anesthesia: MAC, Topical Akten  Complications: None  Estimated Blood Loss: <32m (minimal)  Specimens: None  Implants: As above  Indications:  Visually significant age-related cataract, Left Eye  Procedure:  The patient was seen and identified in the pre-operative area. The operative eye was identified and dilated.  The operative eye was marked.  Topical anesthesia was administered to the operative eye.     The patient was then to the operative suite and placed in the supine position.  A timeout was performed confirming the patient, procedure to be performed, and all other relevant information.   The patient's face was prepped and draped in the usual fashion for intra-ocular surgery.  A lid speculum was placed into the operative eye and the surgical microscope moved into place and focused.  An inferotemporal paracentesis was created using a 20 gauge paracentesis blade. Vision Blue was used to stain the anterior chamber.  Shugarcaine was injected into the anterior chamber.  Viscoelastic was injected into the anterior chamber.  A temporal clear-corneal main wound incision was created using a 2.470mmicrokeratome.  A continuous curvilinear capsulorrhexis was initiated using an irrigating cystitome and completed using capsulorrhexis forceps.  Hydrodissection and hydrodeliniation were performed.  Viscoelastic was injected into the anterior chamber.  A phacoemulsification handpiece and a chopper as a second instrument were used to remove the nucleus and epinucleus. The  irrigation/aspiration handpiece was used to remove any remaining cortical material.   The capsular bag was reinflated with viscoelastic, checked, and found to be intact.  The intraocular lens was inserted into the capsular bag.  The irrigation/aspiration handpiece was used to remove any remaining viscoelastic.  The clear corneal wound and paracentesis wounds were then hydrated and checked with Weck-Cels to be watertight.  Maxitrol was instilled in the eye. The lid-speculum was removed.  The drape was removed.  The patient's face was cleaned with a wet and dry 4x4.    A clear shield was taped over the eye. The patient was taken to the post-operative care unit in good condition, having tolerated the procedure well.  Post-Op Instructions: [facility] and will receive all other orders and instructions.

## 2021-08-29 NOTE — Anesthesia Postprocedure Evaluation (Signed)
Anesthesia Post Note  Patient: Miranda Rose  Procedure(s) Performed: CATARACT EXTRACTION PHACO AND INTRAOCULAR LENS PLACEMENT (IOC) (Left: Eye)  Patient location during evaluation: Phase II Anesthesia Type: MAC Level of consciousness: awake Pain management: pain level controlled Vital Signs Assessment: post-procedure vital signs reviewed and stable Respiratory status: spontaneous breathing and respiratory function stable Cardiovascular status: blood pressure returned to baseline and stable Postop Assessment: no headache and no apparent nausea or vomiting Anesthetic complications: no Comments: Late entry   No notable events documented.   Last Vitals:  Vitals:   08/29/21 1306 08/29/21 1428  BP: (!) 153/70 (!) 185/68  Pulse: 69 82  Resp: 17 18  Temp: 36.7 C 36.6 C  SpO2: 96% 100%    Last Pain:  Vitals:   08/29/21 1428  TempSrc: Axillary  PainSc: 0-No pain                 Louann Sjogren

## 2021-08-29 NOTE — Anesthesia Preprocedure Evaluation (Addendum)
Anesthesia Evaluation  Patient identified by MRN, date of birth, ID band Patient awake    Reviewed: Allergy & Precautions, H&P , NPO status , Patient's Chart, lab work & pertinent test results, reviewed documented beta blocker date and time   Airway Mallampati: II  TM Distance: >3 FB Neck ROM: full    Dental no notable dental hx.    Pulmonary neg pulmonary ROS,    Pulmonary exam normal breath sounds clear to auscultation       Cardiovascular Exercise Tolerance: Good hypertension, negative cardio ROS   Rhythm:regular Rate:Normal     Neuro/Psych negative neurological ROS  negative psych ROS   GI/Hepatic negative GI ROS, Neg liver ROS,   Endo/Other  negative endocrine ROS  Renal/GU negative Renal ROS  negative genitourinary   Musculoskeletal   Abdominal   Peds  Hematology negative hematology ROS (+)   Anesthesia Other Findings   Reproductive/Obstetrics negative OB ROS                             Anesthesia Physical Anesthesia Plan  ASA: 2  Anesthesia Plan: MAC   Post-op Pain Management:    Induction:   PONV Risk Score and Plan:   Airway Management Planned:   Additional Equipment:   Intra-op Plan:   Post-operative Plan:   Informed Consent: I have reviewed the patients History and Physical, chart, labs and discussed the procedure including the risks, benefits and alternatives for the proposed anesthesia with the patient or authorized representative who has indicated his/her understanding and acceptance.     Dental Advisory Given  Plan Discussed with: CRNA  Anesthesia Plan Comments:         Anesthesia Quick Evaluation  

## 2021-08-29 NOTE — Discharge Instructions (Signed)
Please discharge patient when stable, will follow up today with Dr. Zaelynn Fuchs at the [facility] office immediately following discharge.  Leave shield in place until visit.  All paperwork with discharge instructions will be given at the office.  [facility] Address:  [address]  [place], [place] [address]  

## 2021-08-30 ENCOUNTER — Encounter (HOSPITAL_COMMUNITY): Payer: Self-pay | Admitting: Ophthalmology

## 2021-09-05 DIAGNOSIS — H25811 Combined forms of age-related cataract, right eye: Secondary | ICD-10-CM | POA: Diagnosis not present

## 2021-09-06 NOTE — H&P (Signed)
Surgical History & Physical  Patient Name: Miranda Rose DOB: 01/29/1936  Surgery: Cataract extraction with intraocular lens implant phacoemulsification; Right Eye  Surgeon: Fabio Pierce MD Surgery Date:  09-12-21 Pre-Op Date:  09-05-21  HPI: A 4 Yr. old female patient 1. The patient is returning after cataract surgery. The left eye is affected. Status post cataract surgery, which began 1 weeks ago: Since the last visit, the affected area is doing well. The patient's vision is improved and brighter. Patient is following medication instructions. 2. 2. The patient is returning for a cataract follow-up of the right eye. Since the last visit, the affected area is worsening. The complaint is associated with light sensitivity. Patient complains of difficulty driving at night due to halos/glare. This is negatively affecting the patient's quality of life and the patient is unable to function adequately in life with the current level of vision.  Medical History: Cataracts  High Blood Pressure LDL  Review of Systems Negative Allergic/Immunologic Negative Cardiovascular Negative Constitutional Negative Ear, Nose, Mouth & Throat Negative Endocrine Negative Eyes Negative Gastrointestinal Negative Genitourinary Negative Hemotologic/Lymphatic Negative Integumentary Negative Musculoskeletal Negative Neurological Negative Psychiatry Negative Respiratory  Social   Never smoked   Medication Prednisolone-Moxifloxacin-Bromfenac,  Blood pressure medication (unknown), Cholesterol medication (unknown name),   Sx/Procedures Phaco c IOL OS,    Drug Allergies   NKDA  History & Physical: Heent: Cataract, right eye NECK: supple without bruits LUNGS: lungs clear to auscultation CV: regular rate and rhythm Abdomen: soft and non-tender Impression & Plan: Assessment: 1.  CATARACT EXTRACTION STATUS; Left Eye (Z98.42) 2.  COMBINED FORMS AGE RELATED CATARACT; Right Eye (H25.811)  Plan: 1.  1  week after cataract surgery. Doing well with improved vision and normal eye pressure. Call with any problems or concerns. Continue Pred-Moxi-Brom 2x/day for 3 more weeks.  2.  Cataract accounts for the patient's decreased vision. This visual impairment is not correctable with a tolerable change in glasses or contact lenses. Cataract surgery with an implantation of a new lens should significantly improve the visual and functional status of the patient. Discussed all risks, benefits, alternatives, and potential complications. Discussed the procedures and recovery. Patient desires to have surgery. A-scan ordered and performed today for intra-ocular lens calculations. The surgery will be performed in order to improve vision for driving, reading, and for eye examinations. Recommend phacoemulsification with intra-ocular lens. Recommend Dextenza for post-operative pain and inflammation. Right Eye. Surgery required to correct imbalance of vision. Dilates poorly - shugarcaine by protocol. Vision [employer].

## 2021-09-07 ENCOUNTER — Encounter (HOSPITAL_COMMUNITY)
Admission: RE | Admit: 2021-09-07 | Discharge: 2021-09-07 | Disposition: A | Discharge status: Home / Self Care | Payer: Medicare Other | Source: Ambulatory Visit | Attending: Ophthalmology | Admitting: Ophthalmology

## 2021-09-12 ENCOUNTER — Encounter (HOSPITAL_COMMUNITY)
Admission: RE | Disposition: A | Discharge status: Home / Self Care | Payer: Self-pay | Source: Home / Self Care | Attending: Ophthalmology

## 2021-09-12 ENCOUNTER — Ambulatory Visit (HOSPITAL_COMMUNITY): Payer: Medicare Other | Admitting: Anesthesiology

## 2021-09-12 ENCOUNTER — Encounter (HOSPITAL_COMMUNITY): Payer: Self-pay | Admitting: Ophthalmology

## 2021-09-12 ENCOUNTER — Ambulatory Visit (HOSPITAL_COMMUNITY)
Admission: RE | Admit: 2021-09-12 | Discharge: 2021-09-12 | Disposition: A | Discharge status: Home / Self Care | Payer: Medicare Other | Attending: Ophthalmology | Admitting: Ophthalmology

## 2021-09-12 ENCOUNTER — Ambulatory Visit (HOSPITAL_BASED_OUTPATIENT_CLINIC_OR_DEPARTMENT_OTHER): Payer: Medicare Other | Admitting: Anesthesiology

## 2021-09-12 DIAGNOSIS — I10 Essential (primary) hypertension: Secondary | ICD-10-CM | POA: Insufficient documentation

## 2021-09-12 DIAGNOSIS — H25811 Combined forms of age-related cataract, right eye: Secondary | ICD-10-CM | POA: Diagnosis not present

## 2021-09-12 HISTORY — PX: CATARACT EXTRACTION W/PHACO: SHX586

## 2021-09-12 SURGERY — PHACOEMULSIFICATION, CATARACT, WITH IOL INSERTION
Anesthesia: Monitor Anesthesia Care | Site: Eye | Laterality: Right

## 2021-09-12 MED ORDER — FENTANYL CITRATE (PF) 100 MCG/2ML IJ SOLN
INTRAMUSCULAR | Status: DC | PRN
  Administered 2021-09-12: 50 ug via INTRAVENOUS

## 2021-09-12 MED ORDER — LIDOCAINE HCL 3.5 % OP GEL
1.0000 | Freq: Once | OPHTHALMIC | Status: AC
  Administered 2021-09-12: 1 via OPHTHALMIC

## 2021-09-12 MED ORDER — POVIDONE-IODINE 5 % OP SOLN
OPHTHALMIC | Status: DC | PRN
  Administered 2021-09-12: 1 via OPHTHALMIC

## 2021-09-12 MED ORDER — BSS IO SOLN
INTRAOCULAR | Status: DC | PRN
  Administered 2021-09-12: 15 mL via INTRAOCULAR

## 2021-09-12 MED ORDER — NEOMYCIN-POLYMYXIN-DEXAMETH 3.5-10000-0.1 OP SUSP
OPHTHALMIC | Status: DC | PRN
  Administered 2021-09-12: 1 [drp] via OPHTHALMIC

## 2021-09-12 MED ORDER — LIDOCAINE HCL (PF) 1 % IJ SOLN
INTRAOCULAR | Status: DC | PRN
  Administered 2021-09-12: 1 mL via OPHTHALMIC

## 2021-09-12 MED ORDER — TETRACAINE HCL 0.5 % OP SOLN
1.0000 [drp] | OPHTHALMIC | Status: AC | PRN
Start: 2021-09-12 — End: 2021-09-12
  Administered 2021-09-12 (×3): 1 [drp] via OPHTHALMIC

## 2021-09-12 MED ORDER — TROPICAMIDE 1 % OP SOLN
1.0000 [drp] | OPHTHALMIC | Status: AC | PRN
  Administered 2021-09-12 (×3): 1 [drp] via OPHTHALMIC

## 2021-09-12 MED ORDER — PHENYLEPHRINE HCL 2.5 % OP SOLN
1.0000 [drp] | OPHTHALMIC | Status: AC | PRN
Start: 2021-09-12 — End: 2021-09-12
  Administered 2021-09-12 (×3): 1 [drp] via OPHTHALMIC

## 2021-09-12 MED ORDER — MIDAZOLAM HCL 2 MG/2ML IJ SOLN
INTRAMUSCULAR | Status: AC
  Filled 2021-09-12: qty 2

## 2021-09-12 MED ORDER — FENTANYL CITRATE (PF) 100 MCG/2ML IJ SOLN
INTRAMUSCULAR | Status: AC
  Filled 2021-09-12: qty 2

## 2021-09-12 MED ORDER — SODIUM HYALURONATE 10 MG/ML IO SOLUTION
PREFILLED_SYRINGE | INTRAOCULAR | Status: DC | PRN
  Administered 2021-09-12: 0.85 mL via INTRAOCULAR

## 2021-09-12 MED ORDER — SODIUM CHLORIDE 0.9% FLUSH
INTRAVENOUS | Status: DC | PRN
  Administered 2021-09-12: 3 mL via INTRAVENOUS

## 2021-09-12 MED ORDER — TRYPAN BLUE 0.06 % IO SOSY
PREFILLED_SYRINGE | INTRAOCULAR | Status: AC
  Filled 2021-09-12: qty 0.5

## 2021-09-12 MED ORDER — SODIUM HYALURONATE 23MG/ML IO SOSY
PREFILLED_SYRINGE | INTRAOCULAR | Status: DC | PRN
  Administered 2021-09-12: 0.6 mL via INTRAOCULAR

## 2021-09-12 MED ORDER — MIDAZOLAM HCL 5 MG/5ML IJ SOLN
INTRAMUSCULAR | Status: DC | PRN
  Administered 2021-09-12: 1 mg via INTRAVENOUS

## 2021-09-12 MED ORDER — EPINEPHRINE PF 1 MG/ML IJ SOLN
INTRAOCULAR | Status: DC | PRN
  Administered 2021-09-12: 500 mL

## 2021-09-12 MED ORDER — STERILE WATER FOR IRRIGATION IR SOLN
Status: DC | PRN
  Administered 2021-09-12: 250 mL

## 2021-09-12 SURGICAL SUPPLY — 19 items
CATARACT SUITE SIGHTPATH (MISCELLANEOUS) ×2 IMPLANT
CLOTH BEACON ORANGE TIMEOUT ST (SAFETY) ×2 IMPLANT
EYE SHIELD UNIVERSAL CLEAR (GAUZE/BANDAGES/DRESSINGS) ×1 IMPLANT
FEE CATARACT SUITE SIGHTPATH (MISCELLANEOUS) ×1 IMPLANT
GLOVE BIOGEL PI IND STRL 7.0 (GLOVE) ×2 IMPLANT
GLOVE BIOGEL PI IND STRL 8.5 (GLOVE) IMPLANT
GLOVE BIOGEL PI INDICATOR 7.0 (GLOVE) ×2
GLOVE BIOGEL PI INDICATOR 8.5 (GLOVE) ×1
GOWN STRL REUS W/TWL XL LVL3 (GOWN DISPOSABLE) ×1 IMPLANT
LENS IOL RAYNER 15.5 (Intraocular Lens) ×2 IMPLANT
LENS IOL RAYONE EMV 15.5 (Intraocular Lens) IMPLANT
NDL HYPO 18GX1.5 BLUNT FILL (NEEDLE) IMPLANT
NEEDLE HYPO 18GX1.5 BLUNT FILL (NEEDLE) ×2 IMPLANT
PAD ARMBOARD 7.5X6 YLW CONV (MISCELLANEOUS) ×2 IMPLANT
RING MALYGIN 7.0 (MISCELLANEOUS) IMPLANT
SYR TB 1ML LL NO SAFETY (SYRINGE) ×2 IMPLANT
TAPE SURG TRANSPORE 1 IN (GAUZE/BANDAGES/DRESSINGS) IMPLANT
TAPE SURGICAL TRANSPORE 1 IN (GAUZE/BANDAGES/DRESSINGS) ×2
WATER STERILE IRR 250ML POUR (IV SOLUTION) ×2 IMPLANT

## 2021-09-12 NOTE — Anesthesia Preprocedure Evaluation (Signed)
Anesthesia Evaluation  Patient identified by MRN, date of birth, ID band Patient awake    Reviewed: Allergy & Precautions, H&P , NPO status , Patient's Chart, lab work & pertinent test results, reviewed documented beta blocker date and time   Airway Mallampati: II  TM Distance: >3 FB Neck ROM: full    Dental no notable dental hx.    Pulmonary neg pulmonary ROS,    Pulmonary exam normal breath sounds clear to auscultation       Cardiovascular Exercise Tolerance: Good hypertension, negative cardio ROS   Rhythm:regular Rate:Normal     Neuro/Psych negative neurological ROS  negative psych ROS   GI/Hepatic negative GI ROS, Neg liver ROS,   Endo/Other  negative endocrine ROS  Renal/GU negative Renal ROS  negative genitourinary   Musculoskeletal   Abdominal   Peds  Hematology negative hematology ROS (+)   Anesthesia Other Findings   Reproductive/Obstetrics negative OB ROS                             Anesthesia Physical Anesthesia Plan  ASA: 2  Anesthesia Plan: MAC   Post-op Pain Management:    Induction:   PONV Risk Score and Plan:   Airway Management Planned:   Additional Equipment:   Intra-op Plan:   Post-operative Plan:   Informed Consent: I have reviewed the patients History and Physical, chart, labs and discussed the procedure including the risks, benefits and alternatives for the proposed anesthesia with the patient or authorized representative who has indicated his/her understanding and acceptance.     Dental Advisory Given  Plan Discussed with: CRNA  Anesthesia Plan Comments:         Anesthesia Quick Evaluation  

## 2021-09-14 NOTE — Anesthesia Postprocedure Evaluation (Signed)
Anesthesia Post Note  Patient: Miranda Rose  Procedure(s) Performed: CATARACT EXTRACTION PHACO AND INTRAOCULAR LENS PLACEMENT (IOC) (Right: Eye)  Patient location during evaluation: Phase II Anesthesia Type: MAC Level of consciousness: awake Pain management: pain level controlled Vital Signs Assessment: post-procedure vital signs reviewed and stable Respiratory status: spontaneous breathing and respiratory function stable Cardiovascular status: blood pressure returned to baseline and stable Postop Assessment: no headache and no apparent nausea or vomiting Anesthetic complications: no Comments: Late entry   No notable events documented.   Last Vitals:  Vitals:   09/12/21 1001 09/12/21 1004  BP:  134/71  Pulse: 79   Resp: 19   Temp: 36.7 C   SpO2: 100%     Last Pain:  Vitals:   09/12/21 1001  TempSrc: Oral  PainSc:                  Louann Sjogren

## 2021-09-15 ENCOUNTER — Encounter (HOSPITAL_COMMUNITY): Payer: Self-pay | Admitting: Ophthalmology

## 2021-09-23 DIAGNOSIS — N182 Chronic kidney disease, stage 2 (mild): Secondary | ICD-10-CM | POA: Diagnosis not present

## 2021-09-23 DIAGNOSIS — Z0001 Encounter for general adult medical examination with abnormal findings: Secondary | ICD-10-CM | POA: Diagnosis not present

## 2021-09-23 DIAGNOSIS — H9312 Tinnitus, left ear: Secondary | ICD-10-CM | POA: Diagnosis not present

## 2021-09-23 DIAGNOSIS — I10 Essential (primary) hypertension: Secondary | ICD-10-CM | POA: Diagnosis not present

## 2021-09-23 DIAGNOSIS — E782 Mixed hyperlipidemia: Secondary | ICD-10-CM | POA: Diagnosis not present

## 2021-09-23 DIAGNOSIS — E1129 Type 2 diabetes mellitus with other diabetic kidney complication: Secondary | ICD-10-CM | POA: Diagnosis not present

## 2021-10-13 DIAGNOSIS — E1129 Type 2 diabetes mellitus with other diabetic kidney complication: Secondary | ICD-10-CM | POA: Diagnosis not present

## 2021-10-13 DIAGNOSIS — L299 Pruritus, unspecified: Secondary | ICD-10-CM | POA: Diagnosis not present

## 2022-01-12 DIAGNOSIS — J069 Acute upper respiratory infection, unspecified: Secondary | ICD-10-CM | POA: Diagnosis not present

## 2022-01-12 DIAGNOSIS — I10 Essential (primary) hypertension: Secondary | ICD-10-CM | POA: Diagnosis not present

## 2022-03-10 DIAGNOSIS — R55 Syncope and collapse: Secondary | ICD-10-CM | POA: Diagnosis not present

## 2022-03-23 DIAGNOSIS — R55 Syncope and collapse: Secondary | ICD-10-CM | POA: Diagnosis not present

## 2022-03-23 DIAGNOSIS — E782 Mixed hyperlipidemia: Secondary | ICD-10-CM | POA: Diagnosis not present

## 2022-03-23 DIAGNOSIS — N182 Chronic kidney disease, stage 2 (mild): Secondary | ICD-10-CM | POA: Diagnosis not present

## 2022-03-23 DIAGNOSIS — E1129 Type 2 diabetes mellitus with other diabetic kidney complication: Secondary | ICD-10-CM | POA: Diagnosis not present

## 2022-03-23 DIAGNOSIS — E7849 Other hyperlipidemia: Secondary | ICD-10-CM | POA: Diagnosis not present

## 2022-03-23 DIAGNOSIS — I10 Essential (primary) hypertension: Secondary | ICD-10-CM | POA: Diagnosis not present

## 2022-04-20 DIAGNOSIS — E782 Mixed hyperlipidemia: Secondary | ICD-10-CM | POA: Diagnosis not present

## 2022-04-20 DIAGNOSIS — E1129 Type 2 diabetes mellitus with other diabetic kidney complication: Secondary | ICD-10-CM | POA: Diagnosis not present

## 2022-04-20 DIAGNOSIS — E119 Type 2 diabetes mellitus without complications: Secondary | ICD-10-CM | POA: Diagnosis not present

## 2022-04-20 DIAGNOSIS — I10 Essential (primary) hypertension: Secondary | ICD-10-CM | POA: Diagnosis not present

## 2022-04-20 DIAGNOSIS — N182 Chronic kidney disease, stage 2 (mild): Secondary | ICD-10-CM | POA: Diagnosis not present

## 2022-07-27 DIAGNOSIS — E782 Mixed hyperlipidemia: Secondary | ICD-10-CM | POA: Diagnosis not present

## 2022-07-27 DIAGNOSIS — E1129 Type 2 diabetes mellitus with other diabetic kidney complication: Secondary | ICD-10-CM | POA: Diagnosis not present

## 2022-07-27 DIAGNOSIS — N182 Chronic kidney disease, stage 2 (mild): Secondary | ICD-10-CM | POA: Diagnosis not present

## 2022-07-27 DIAGNOSIS — I10 Essential (primary) hypertension: Secondary | ICD-10-CM | POA: Diagnosis not present

## 2022-07-27 DIAGNOSIS — E7849 Other hyperlipidemia: Secondary | ICD-10-CM | POA: Diagnosis not present

## 2022-08-24 DIAGNOSIS — G629 Polyneuropathy, unspecified: Secondary | ICD-10-CM | POA: Diagnosis not present

## 2022-08-24 DIAGNOSIS — I10 Essential (primary) hypertension: Secondary | ICD-10-CM | POA: Diagnosis not present

## 2022-08-24 DIAGNOSIS — N182 Chronic kidney disease, stage 2 (mild): Secondary | ICD-10-CM | POA: Diagnosis not present

## 2022-11-02 DIAGNOSIS — E1129 Type 2 diabetes mellitus with other diabetic kidney complication: Secondary | ICD-10-CM | POA: Diagnosis not present

## 2023-02-01 DIAGNOSIS — E1129 Type 2 diabetes mellitus with other diabetic kidney complication: Secondary | ICD-10-CM | POA: Diagnosis not present

## 2023-02-01 DIAGNOSIS — E782 Mixed hyperlipidemia: Secondary | ICD-10-CM | POA: Diagnosis not present

## 2023-02-01 DIAGNOSIS — Z0001 Encounter for general adult medical examination with abnormal findings: Secondary | ICD-10-CM | POA: Diagnosis not present

## 2023-02-01 DIAGNOSIS — G629 Polyneuropathy, unspecified: Secondary | ICD-10-CM | POA: Diagnosis not present

## 2023-02-01 DIAGNOSIS — I10 Essential (primary) hypertension: Secondary | ICD-10-CM | POA: Diagnosis not present

## 2023-02-01 DIAGNOSIS — N182 Chronic kidney disease, stage 2 (mild): Secondary | ICD-10-CM | POA: Diagnosis not present

## 2023-08-30 DIAGNOSIS — E1129 Type 2 diabetes mellitus with other diabetic kidney complication: Secondary | ICD-10-CM | POA: Diagnosis not present

## 2023-08-30 DIAGNOSIS — I10 Essential (primary) hypertension: Secondary | ICD-10-CM | POA: Diagnosis not present

## 2023-08-30 DIAGNOSIS — E782 Mixed hyperlipidemia: Secondary | ICD-10-CM | POA: Diagnosis not present

## 2023-12-18 DIAGNOSIS — Z7689 Persons encountering health services in other specified circumstances: Secondary | ICD-10-CM | POA: Diagnosis not present

## 2023-12-18 DIAGNOSIS — Z7984 Long term (current) use of oral hypoglycemic drugs: Secondary | ICD-10-CM | POA: Diagnosis not present

## 2023-12-18 DIAGNOSIS — I10 Essential (primary) hypertension: Secondary | ICD-10-CM | POA: Diagnosis not present

## 2023-12-18 DIAGNOSIS — Z79899 Other long term (current) drug therapy: Secondary | ICD-10-CM | POA: Diagnosis not present

## 2023-12-18 DIAGNOSIS — E782 Mixed hyperlipidemia: Secondary | ICD-10-CM | POA: Diagnosis not present

## 2023-12-18 DIAGNOSIS — E1129 Type 2 diabetes mellitus with other diabetic kidney complication: Secondary | ICD-10-CM | POA: Diagnosis not present
